# Patient Record
Sex: Male | Born: 1995 | Race: White | Hispanic: No | Marital: Single | Smoking: Never smoker
Health system: Southern US, Community
[De-identification: ages and names within clinical notes are randomized; demographics above are authoritative.]

## PROBLEM LIST (undated history)

## (undated) DIAGNOSIS — Z87442 Personal history of urinary calculi: Secondary | ICD-10-CM

## (undated) DIAGNOSIS — K219 Gastro-esophageal reflux disease without esophagitis: Secondary | ICD-10-CM

## (undated) DIAGNOSIS — J45909 Unspecified asthma, uncomplicated: Secondary | ICD-10-CM

## (undated) HISTORY — PX: BACK SURGERY: SHX140

## (undated) HISTORY — DX: Gastro-esophageal reflux disease without esophagitis: K21.9

## (undated) HISTORY — PX: UMBILICAL HERNIA REPAIR: SHX196

## (undated) HISTORY — PX: OTHER SURGICAL HISTORY: SHX169

## (undated) HISTORY — DX: Unspecified asthma, uncomplicated: J45.909

## (undated) HISTORY — DX: Personal history of urinary calculi: Z87.442

---
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
## 2013-01-14 ENCOUNTER — Telehealth: Payer: Self-pay | Admitting: Family Medicine

## 2013-01-14 NOTE — Telephone Encounter (Signed)
Mom wants to try benadryl first- if it dont help she will bring him in

## 2013-04-28 ENCOUNTER — Other Ambulatory Visit: Payer: Self-pay | Admitting: Family Medicine

## 2013-05-06 ENCOUNTER — Ambulatory Visit (INDEPENDENT_AMBULATORY_CARE_PROVIDER_SITE_OTHER): Payer: Commercial Indemnity

## 2013-05-06 ENCOUNTER — Ambulatory Visit (INDEPENDENT_AMBULATORY_CARE_PROVIDER_SITE_OTHER): Payer: Commercial Indemnity | Admitting: Family Medicine

## 2013-05-06 ENCOUNTER — Encounter: Payer: Self-pay | Admitting: Family Medicine

## 2013-05-06 VITALS — BP 128/73 | HR 65 | Temp 97.6°F | Ht 72.0 in | Wt 212.8 lb

## 2013-05-06 DIAGNOSIS — M79609 Pain in unspecified limb: Secondary | ICD-10-CM

## 2013-05-06 DIAGNOSIS — M79672 Pain in left foot: Secondary | ICD-10-CM

## 2013-05-06 MED ORDER — NAPROXEN 500 MG PO TABS: 500.0000 mg | ORAL_TABLET | Freq: Two times a day (BID) | ORAL | Status: DC

## 2013-05-06 NOTE — Progress Notes (Signed)
  Subjective:    Patient ID: Frank Booth, male    DOB: 09-05-1995, 17 y.o.   MRN: [id number]  HPI This 17 y.o. male presents for evaluation of left foot pain. He has noticed pain when getting out of bed in the am.   Review of Systems C/o left foot pain. No chest pain, SOB, HA, dizziness, vision change, N/V, diarrhea, constipation, dysuria, urinary urgency or frequency or rash.     Objective:   Physical Exam Vital signs noted  Well developed well nourished male.  HEENT - Head atraumatic Normocephalic Respiratory - Lungs CTA bilateral Cardiac - RRR S1 and S2 without murmur MS - TTP left plantar surface of foot.       Assessment & Plan:  Foot pain, left - Plan: DG Foot Complete Left, naproxen (NAPROSYN) 500 MG tablet po bid x 10 days #20.  Stretch foot out bid

## 2013-05-06 NOTE — Patient Instructions (Signed)
Place foot pain patient instructions here.

## 2013-05-06 NOTE — Addendum Note (Signed)
Addended by: Deatra Canter on: 05/06/2013 04:15 PM   Modules accepted: Orders

## 2013-05-07 ENCOUNTER — Ambulatory Visit: Payer: Commercial Indemnity | Admitting: Family Medicine

## 2013-06-16 ENCOUNTER — Telehealth: Payer: Self-pay | Admitting: Family Medicine

## 2013-06-16 NOTE — Telephone Encounter (Signed)
Patient advised if he needed to be seen today to go to urgent care do to [place] being out of spots

## 2013-07-10 ENCOUNTER — Other Ambulatory Visit: Payer: Self-pay | Admitting: Family Medicine

## 2013-07-18 ENCOUNTER — Telehealth: Payer: Self-pay | Admitting: Nurse Practitioner

## 2013-07-18 ENCOUNTER — Ambulatory Visit (INDEPENDENT_AMBULATORY_CARE_PROVIDER_SITE_OTHER): Payer: Commercial Indemnity | Admitting: Nurse Practitioner

## 2013-07-18 ENCOUNTER — Encounter: Payer: Self-pay | Admitting: Nurse Practitioner

## 2013-07-18 VITALS — BP 134/68 | HR 62 | Temp 98.2°F | Ht 72.0 in | Wt 213.0 lb

## 2013-07-18 DIAGNOSIS — K2990 Gastroduodenitis, unspecified, without bleeding: Secondary | ICD-10-CM

## 2013-07-18 DIAGNOSIS — K2970 Gastritis, unspecified, without bleeding: Secondary | ICD-10-CM

## 2013-07-18 MED ORDER — OMEPRAZOLE 40 MG PO CPDR: 40.0000 mg | DELAYED_RELEASE_CAPSULE | Freq: Every day | ORAL | Status: DC

## 2013-07-18 NOTE — Progress Notes (Signed)
   Subjective:    Patient ID: Frank Booth, male    DOB: 1995-11-04, 18 y.o.   MRN: [id number]  Abdominal Pain This is a recurrent problem. The current episode started more than 1 year ago. The onset quality is gradual. The problem occurs intermittently. The problem has been gradually worsening. The pain is located in the epigastric region. The pain is at a severity of 6/10. The pain is moderate. The quality of the pain is sharp and burning. The abdominal pain radiates to the epigastric region. Associated symptoms include nausea. Pertinent negatives include no anorexia, constipation, dysuria, fever, hematuria or vomiting. Nothing aggravates the pain. The pain is relieved by sitting up. He has tried nothing for the symptoms.       Review of Systems  Constitutional: Negative for fever and appetite change.  Respiratory: Negative.   Cardiovascular: Negative.   Gastrointestinal: Positive for nausea and abdominal pain. Negative for vomiting, constipation and anorexia.  Genitourinary: Negative.  Negative for dysuria and hematuria.  All other systems reviewed and are negative.       Objective:   Physical Exam  Constitutional: He is oriented to person, place, and time. He appears well-developed and well-nourished.  Cardiovascular: Normal rate, regular rhythm and normal heart sounds.   Pulmonary/Chest: Effort normal and breath sounds normal.  Abdominal: Soft. Bowel sounds are normal. There is tenderness (miud epigastric to right upper quadrant).  Neurological: He is alert and oriented to person, place, and time.  Skin: Skin is warm and dry.  Psychiatric: He has a normal mood and affect. His behavior is normal. Judgment and thought content normal.   BP 134/68  Pulse 62  Temp(Src) 98.2 F (36.8 C) (Oral)  Ht 6' (1.829 m)  Wt 213 lb (96.616 kg)  BMI 28.88 kg/m2        Assessment & Plan:   1. Gastritis    Meds ordered this encounter  Medications  . omeprazole (PRILOSEC) 40 MG  capsule    Sig: Take 1 capsule (40 mg total) by mouth daily.    Dispense:  30 capsule    Refill:  3    Order Specific Question:  Supervising Provider    Answer:  Ernestina PennaMOORE, DONALD W [[id number]]   Avoid NSAIDS for now Avoid spicy and fatty foods until pain improves Follow upprn  Mary-Margaret Daphine DeutscherMartin, FNP

## 2013-07-18 NOTE — Patient Instructions (Signed)

## 2013-07-28 ENCOUNTER — Ambulatory Visit (INDEPENDENT_AMBULATORY_CARE_PROVIDER_SITE_OTHER): Payer: Commercial Indemnity

## 2013-07-28 ENCOUNTER — Ambulatory Visit (INDEPENDENT_AMBULATORY_CARE_PROVIDER_SITE_OTHER): Payer: Commercial Indemnity | Admitting: Family Medicine

## 2013-07-28 ENCOUNTER — Ambulatory Visit: Payer: Self-pay | Admitting: Family Medicine

## 2013-07-28 ENCOUNTER — Encounter: Payer: Self-pay | Admitting: Family Medicine

## 2013-07-28 ENCOUNTER — Encounter: Payer: Self-pay | Admitting: *Deleted

## 2013-07-28 VITALS — BP 122/59 | HR 69 | Temp 97.0°F | Ht 72.0 in | Wt 213.0 lb

## 2013-07-28 DIAGNOSIS — M79605 Pain in left leg: Secondary | ICD-10-CM

## 2013-07-28 DIAGNOSIS — M79609 Pain in unspecified limb: Secondary | ICD-10-CM

## 2013-07-28 DIAGNOSIS — M25559 Pain in unspecified hip: Secondary | ICD-10-CM

## 2013-07-28 DIAGNOSIS — G629 Polyneuropathy, unspecified: Secondary | ICD-10-CM

## 2013-07-28 DIAGNOSIS — M545 Low back pain, unspecified: Secondary | ICD-10-CM

## 2013-07-28 DIAGNOSIS — G589 Mononeuropathy, unspecified: Secondary | ICD-10-CM

## 2013-07-28 MED ORDER — PREDNISONE 10 MG PO TABS: ORAL_TABLET | ORAL | Status: DC

## 2013-07-28 MED ORDER — METHYLPREDNISOLONE ACETATE 80 MG/ML IJ SUSP
60.0000 mg | Freq: Once | INTRAMUSCULAR | Status: AC
  Administered 2013-07-28: 60 mg via INTRAMUSCULAR

## 2013-07-28 NOTE — Progress Notes (Signed)
Subjective:    Patient ID: Frank Booth, male    DOB: 1996-04-26, 18 y.o.   MRN: [id number]  HPI Patient here today for left thigh pain and numbness. The patient complains of left anterior thigh pain. There's also pain in the left hip which goes to the left knee. It's worse with standing. The patient recalls no injury. He denies any fall. He did go to an urgent care a couple of weeks ago when this started and they gave him an NSAID and he had trouble taking this because it irritated his stomach. this medication was Naprosyn 500 and Nexium.  There is also numbness associated with the pain.     There are no active problems to display for this patient.  Outpatient Encounter Prescriptions as of 07/28/2013  Medication Sig  . naproxen (NAPROSYN) 500 MG tablet TAKE 1 TABLET TWICE A DAY WITH FOOD  . omeprazole (PRILOSEC) 40 MG capsule Take 1 capsule (40 mg total) by mouth daily.    Review of Systems  Constitutional: Negative.   HENT: Negative.   Eyes: Negative.   Respiratory: Negative.   Cardiovascular: Negative.   Gastrointestinal: Negative.   Endocrine: Negative.   Genitourinary: Negative.   Musculoskeletal: Negative.        Left leg pain With numbness after sitting  Skin: Negative.   Allergic/Immunologic: Negative.   Neurological: Negative.   Hematological: Negative.   Psychiatric/Behavioral: Negative.        Objective:   Physical Exam  Nursing note and vitals reviewed. Constitutional: He is oriented to person, place, and time. He appears well-developed and well-nourished. No distress.  HENT:  Head: Normocephalic and atraumatic.  Eyes: Conjunctivae and EOM are normal. Pupils are equal, round, and reactive to light. Right eye exhibits no discharge. Left eye exhibits no discharge. No scleral icterus.  Neck: Normal range of motion.  Pulmonary/Chest: No respiratory distress.  Abdominal: Soft. Bowel sounds are normal. He exhibits no mass. There is no tenderness. There is no  rebound and no guarding.  Musculoskeletal: He exhibits no edema and no tenderness.  Pain in left anterior thigh with leg raising and good leg raising against resistance. Pain with left hip abduction in left anterior thigh. No pain on the right with similar movements. There was no pooling in the back that reproduce pain with palpation.  Lymphadenopathy:    He has no cervical adenopathy.  Neurological: He is alert and oriented to person, place, and time. He has normal reflexes. No cranial nerve deficit.  Reflexes were equal but diminished bilaterally  Skin: Skin is warm and dry. No rash noted.  Psychiatric: He has a normal mood and affect. His behavior is normal. Judgment and thought content normal.   BP 122/59  Pulse 69  Temp(Src) 97 F (36.1 C) (Oral)  Ht 6' (1.829 m)  Wt 213 lb (96.616 kg)  BMI 28.88 kg/m2  [facility] reading (PRIMARY) by  Dr.Moore-LS spine and left hip  -slight scoliosis in the spine otherwise normal hip                                     Assessment & Plan:  1. Left leg pain - DG Lumbar Spine 2-3 Views; Future - DG Hip Complete Left; Future - methylPREDNISolone acetate (DEPO-MEDROL) injection 60 mg; Inject 0.75 mLs (60 mg total) into the muscle once. - predniSONE (DELTASONE) 10 MG tablet; 1 tablet 4 times a day  for 2 days,  1 tablet 3 times a day for 2 days,  1 tablet 2 times a day for 2 days, 1 tablet daily for 2 days  Dispense: 20 tablet; Refill: 0  2. Low back pain - methylPREDNISolone acetate (DEPO-MEDROL) injection 60 mg; Inject 0.75 mLs (60 mg total) into the muscle once. - predniSONE (DELTASONE) 10 MG tablet; 1 tablet 4 times a day for 2 days,  1 tablet 3 times a day for 2 days,  1 tablet 2 times a day for 2 days, 1 tablet daily for 2 days  Dispense: 20 tablet; Refill: 0  3. Neuropathy - methylPREDNISolone acetate (DEPO-MEDROL) injection 60 mg; Inject 0.75 mLs (60 mg total) into the muscle once. - predniSONE (DELTASONE) 10 MG tablet; 1 tablet 4 times a  day for 2 days,  1 tablet 3 times a day for 2 days,  1 tablet 2 times a day for 2 days, 1 tablet daily for 2 days  Dispense: 20 tablet; Refill: 0  Patient Instructions  Use warm wet compresses 20 minutes 3 or 4 times daily to low back Take medication as directed Avoid if possible heavy lifting pushing pulling or straining Discontinue the Naprosyn that has bothered her stomach at home You may take extra strength Tylenol if needed for pain Remain out of work until rechecked in about one week   Nyra Capeson W. Moore MD

## 2013-07-28 NOTE — Patient Instructions (Addendum)
Use warm wet compresses 20 minutes 3 or 4 times daily to low back Take medication as directed Avoid if possible heavy lifting pushing pulling or straining Discontinue the Naprosyn that has bothered her stomach at home You may take extra strength Tylenol if needed for pain Remain out of work until rechecked in about one week

## 2013-08-04 ENCOUNTER — Encounter: Payer: Self-pay | Admitting: Family Medicine

## 2013-08-04 ENCOUNTER — Ambulatory Visit (INDEPENDENT_AMBULATORY_CARE_PROVIDER_SITE_OTHER): Payer: Commercial Indemnity | Admitting: Family Medicine

## 2013-08-04 ENCOUNTER — Encounter: Payer: Self-pay | Admitting: *Deleted

## 2013-08-04 ENCOUNTER — Ambulatory Visit (INDEPENDENT_AMBULATORY_CARE_PROVIDER_SITE_OTHER): Payer: Commercial Indemnity

## 2013-08-04 VITALS — BP 138/69 | HR 73 | Temp 99.4°F | Ht 72.0 in | Wt 216.0 lb

## 2013-08-04 DIAGNOSIS — G629 Polyneuropathy, unspecified: Secondary | ICD-10-CM

## 2013-08-04 DIAGNOSIS — G589 Mononeuropathy, unspecified: Secondary | ICD-10-CM

## 2013-08-04 DIAGNOSIS — M545 Low back pain, unspecified: Secondary | ICD-10-CM

## 2013-08-04 DIAGNOSIS — M25562 Pain in left knee: Secondary | ICD-10-CM

## 2013-08-04 DIAGNOSIS — M25569 Pain in unspecified knee: Secondary | ICD-10-CM
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
## 2013-08-04 NOTE — Patient Instructions (Signed)
Continue to use warm wet compresses to your back 20 minutes 4 times daily Avoid heavy lifting pushing or pulling Reduce the Naprosyn to once daily because of it possibly irritating her stomach Continue to take the omeprazole before breakfast Avoid caffeine

## 2013-08-11 ENCOUNTER — Encounter: Payer: Self-pay | Admitting: Family Medicine

## 2013-08-11 ENCOUNTER — Ambulatory Visit (INDEPENDENT_AMBULATORY_CARE_PROVIDER_SITE_OTHER): Payer: Commercial Indemnity | Admitting: Family Medicine

## 2013-08-11 ENCOUNTER — Encounter: Payer: Self-pay | Admitting: *Deleted

## 2013-08-11 VITALS — BP 146/63 | HR 93 | Temp 99.8°F | Ht 72.0 in | Wt 214.0 lb

## 2013-08-11 DIAGNOSIS — M25569 Pain in unspecified knee: Secondary | ICD-10-CM

## 2013-08-11 DIAGNOSIS — G589 Mononeuropathy, unspecified: Secondary | ICD-10-CM

## 2013-08-11 DIAGNOSIS — M545 Low back pain, unspecified: Secondary | ICD-10-CM

## 2013-08-11 DIAGNOSIS — G629 Polyneuropathy, unspecified: Secondary | ICD-10-CM

## 2013-08-11 DIAGNOSIS — H538 Other visual disturbances: Secondary | ICD-10-CM

## 2013-08-11 DIAGNOSIS — Z8669 Personal history of other diseases of the nervous system and sense organs: Secondary | ICD-10-CM

## 2013-08-11 DIAGNOSIS — M25562 Pain in left knee: Secondary | ICD-10-CM

## 2013-08-11 NOTE — Patient Instructions (Signed)
Try to maintain good posture When picking up materials from the ground level use in your knees Continue to watch your sodium intake and caffeine intake Continue to take the omeprazole to your stomach seems to be improving If you continue to have trouble with the headaches call back and make an appointment for us to check you again Take Tylenol as needed for aches pains or fever

## 2013-08-11 NOTE — Progress Notes (Signed)
   Subjective:    Patient ID: Frank Booth, male    DOB: 1996-01-16, 18 y.o.   MRN: [id number]  HPI Patient here today for 1 week follow up for left leg and hip pain. The patient's back pain leg pain and knee pain have improved. The x-rays done of the back and the knee were within normal limits. He also has a history of migraines. And has had more trouble with that recently. He does describe some blurred vision.     There are no active problems to display for this patient.  Outpatient Encounter Prescriptions as of 08/11/2013  Medication Sig  . naproxen (NAPROSYN) 500 MG tablet TAKE 1 TABLET TWICE A DAY WITH FOOD  . omeprazole (PRILOSEC) 40 MG capsule Take 1 capsule (40 mg total) by mouth daily.    Review of Systems  Constitutional: Negative.   HENT: Negative.   Eyes: Negative.   Respiratory: Negative.   Cardiovascular: Negative.   Gastrointestinal: Negative.   Endocrine: Negative.   Genitourinary: Negative.   Musculoskeletal: Positive for arthralgias (left leg and hip pain- better this week).  Skin: Negative.   Allergic/Immunologic: Negative.   Neurological: Negative.   Hematological: Negative.   Psychiatric/Behavioral: Negative.        Objective:   Physical Exam  Nursing note and vitals reviewed. Constitutional: He is oriented to person, place, and time. He appears well-developed and well-nourished. No distress.  HENT:  Head: Normocephalic and atraumatic.  Eyes: Conjunctivae and EOM are normal. Pupils are equal, round, and reactive to light. Right eye exhibits no discharge. Left eye exhibits no discharge. No scleral icterus.  Neck: Normal range of motion. Neck supple. No thyromegaly present.  Cardiovascular: Normal rate, regular rhythm, normal heart sounds and intact distal pulses.  Exam reveals no gallop and no friction rub.   No murmur heard. Pulmonary/Chest: Effort normal and breath sounds normal. No respiratory distress. He has no wheezes. He has no rales. He  exhibits no tenderness.  Abdominal: Soft. Bowel sounds are normal. He exhibits no mass. There is tenderness (minimal epigastric tenderness). There is no rebound and no guarding.  Musculoskeletal: Normal range of motion. He exhibits no edema and no tenderness.  Leg raising and hip abduction bilaterally within normal limits without pain or back pain  Lymphadenopathy:    He has no cervical adenopathy.  Neurological: He is alert and oriented to person, place, and time. He has normal reflexes.  Skin: Skin is warm and dry. No rash noted. No erythema. No pallor.  Psychiatric: He has a normal mood and affect. His behavior is normal. Judgment and thought content normal.   BP 146/63  Pulse 93  Temp(Src) 99.8 F (37.7 C) (Oral)  Ht 6' (1.829 m)  Wt 214 lb (97.07 kg)  BMI 29.02 kg/m2  vision was checked while in the office      Assessment & Plan:  1. Left knee pain, improved  2. Low back pain, improved  3. Neuropathy, improved  4. History of migraine headaches  5. Blurred vision Patient Instructions  Try to maintain good posture When picking up materials from the ground level use in your knees Continue to watch your sodium intake and caffeine intake Continue to take the omeprazole to your stomach seems to be improving If you continue to have trouble with the headaches call back and make an appointment for us to check you again Take Tylenol as needed for aches pains or fever    Nyra Capeson W. Moore MD

## 2013-10-01 ENCOUNTER — Ambulatory Visit (INDEPENDENT_AMBULATORY_CARE_PROVIDER_SITE_OTHER): Payer: Commercial Indemnity | Admitting: Family Medicine

## 2013-10-01 VITALS — BP 128/72 | HR 65 | Temp 97.7°F | Ht 72.0 in | Wt 217.0 lb

## 2013-10-01 DIAGNOSIS — J209 Acute bronchitis, unspecified: Secondary | ICD-10-CM

## 2013-10-01 MED ORDER — ALBUTEROL SULFATE HFA 108 (90 BASE) MCG/ACT IN AERS: 1.0000 | INHALATION_SPRAY | RESPIRATORY_TRACT | Status: DC | PRN

## 2013-10-01 MED ORDER — AZITHROMYCIN 250 MG PO TABS: ORAL_TABLET | ORAL | Status: DC

## 2013-10-01 NOTE — Progress Notes (Signed)
   Subjective:    Patient ID: Frank Booth, male    DOB: 16-Sep-1995, 18 y.o.   MRN: [id number]  HPI  This 18 y.o. male presents for evaluation of URI sx's and wheezing.  He needs refill On albuterol MDI.  Review of Systems    No chest pain, SOB, HA, dizziness, vision change, N/V, diarrhea, constipation, dysuria, urinary urgency or frequency, myalgias, arthralgias or rash.  Objective:   Physical Exam  Vital signs noted  Well developed well nourished male.  HEENT - Head atraumatic Normocephalic                Eyes - PERRLA, Conjuctiva - clear Sclera- Clear EOMI                Ears - EAC's Wnl TM's Wnl Gross Hearing WNL                Nose - Nares patent                 Throat - oropharanx wnl Respiratory - Lungs CTA bilateral Cardiac - RRR S1 and S2 without murmur GI - Abdomen soft Nontender and bowel sounds active x 4 Extremities - No edema. Neuro - Grossly intact.      Assessment & Plan:  Acute bronchitis - Plan: albuterol (PROVENTIL HFA;VENTOLIN HFA) 108 (90 BASE) MCG/ACT inhaler, azithromycin (ZITHROMAX) 250 MG tablet Symbicort 160/4.5 2 puffs bid #1 sample Follow up if not better.  Deatra CanterWilliam J Oxford FNP

## 2014-01-15 ENCOUNTER — Other Ambulatory Visit: Payer: Self-pay | Admitting: Nurse Practitioner

## 2014-02-02 ENCOUNTER — Other Ambulatory Visit: Payer: Self-pay | Admitting: Family Medicine

## 2014-02-02 ENCOUNTER — Ambulatory Visit
Admission: RE | Admit: 2014-02-02 | Discharge: 2014-02-02 | Disposition: A | Discharge status: Home / Self Care | Payer: Managed Care, Other (non HMO) | Source: Ambulatory Visit | Attending: Family Medicine | Admitting: Family Medicine

## 2014-02-02 DIAGNOSIS — R109 Unspecified abdominal pain: Secondary | ICD-10-CM

## 2014-02-02 MED ORDER — IOHEXOL 300 MG/ML  SOLN
100.0000 mL | Freq: Once | INTRAMUSCULAR | Status: AC | PRN
  Administered 2014-02-02: 100 mL via INTRAVENOUS

## 2016-09-02 ENCOUNTER — Emergency Department (HOSPITAL_COMMUNITY)
Admission: EM | Admit: 2016-09-02 | Discharge: 2016-09-02 | Disposition: A | Discharge status: Home / Self Care | Payer: Managed Care, Other (non HMO) | Attending: Emergency Medicine | Admitting: Emergency Medicine

## 2016-09-02 ENCOUNTER — Emergency Department (HOSPITAL_COMMUNITY): Payer: Managed Care, Other (non HMO)

## 2016-09-02 ENCOUNTER — Encounter (HOSPITAL_COMMUNITY): Payer: Self-pay

## 2016-09-02 DIAGNOSIS — R091 Pleurisy: Secondary | ICD-10-CM | POA: Diagnosis not present

## 2016-09-02 DIAGNOSIS — R071 Chest pain on breathing: Secondary | ICD-10-CM | POA: Diagnosis present

## 2016-09-02 LAB — CBC
HEMATOCRIT: 44.8 % (ref 39.0–52.0)
HEMOGLOBIN: 15.7 g/dL (ref 13.0–17.0)
MCH: 32 pg (ref 26.0–34.0)
MCHC: 35 g/dL (ref 30.0–36.0)
MCV: 91.4 fL (ref 78.0–100.0)
Platelets: 297 10*3/uL (ref 150–400)
RBC: 4.9 MIL/uL (ref 4.22–5.81)
RDW: 13 % (ref 11.5–15.5)
WBC: 8.7 10*3/uL (ref 4.0–10.5)

## 2016-09-02 LAB — BASIC METABOLIC PANEL
ANION GAP: 12 (ref 5–15)
BUN: <5 mg/dL — ABNORMAL LOW (ref 6–20)
CHLORIDE: 104 mmol/L (ref 101–111)
CO2: 24 mmol/L (ref 22–32)
Calcium: 9 mg/dL (ref 8.9–10.3)
Creatinine, Ser: 0.98 mg/dL (ref 0.61–1.24)
GFR calc Af Amer: >60 mL/min (ref >60)
GFR calc non Af Amer: >60 mL/min (ref >60)
Glucose, Bld: 92 mg/dL (ref 65–99)
POTASSIUM: 3.6 mmol/L (ref 3.5–5.1)
SODIUM: 140 mmol/L (ref 135–145)

## 2016-09-02 LAB — I-STAT TROPONIN, ED: Troponin i, poc: 0 ng/mL (ref 0.00–0.08)

## 2016-09-02 MED ORDER — BENZONATATE 100 MG PO CAPS: 100.0000 mg | ORAL_CAPSULE | Freq: Three times a day (TID) | ORAL | 0 refills | Status: DC

## 2016-09-02 MED ORDER — NAPROXEN 500 MG PO TABS: 500.0000 mg | ORAL_TABLET | Freq: Two times a day (BID) | ORAL | 0 refills | Status: DC

## 2016-09-02 NOTE — ED Provider Notes (Signed)
[facility]-EMERGENCY DEPT Provider Note   CSN: [id number] Arrival date & time: 09/02/16  [id number]  By signing my name below, I, Nelwyn SalisburyJoshua Fowler, attest that this documentation has been prepared under the direction and in the presence of Linwood DibblesJon Debany Vantol, MD . Electronically Signed: Nelwyn SalisburyJoshua Fowler, Scribe. 09/02/2016. 3:48 AM.  History   Chief Complaint Chief Complaint  Patient presents with  . Chest Pain    The history is provided by the patient. No language interpreter was used.    HPI Comments:  Frank Booth is a 21 y.o. male who presents to the Emergency Department complaining of sudden-onset, constant, mild chest pain which began 12 hours ago. He describes his symptoms as radiating from his chest into his lower back. His pain is exacerbated with deep inhalation. Pt reports associated cough which has been present for about 2 weeks. He denies any leg swelling, fever, vomiting or nausea.   Past Medical History:  Diagnosis Date  . GERD (gastroesophageal reflux disease)     There are no active problems to display for this patient.   Past Surgical History:  Procedure Laterality Date  . tubes in ears    . UMBILICAL HERNIA REPAIR    . Widom teeth extraction         Home Medications    Prior to Admission medications   Medication Sig Start Date End Date Taking? Authorizing Provider  albuterol (PROVENTIL HFA;VENTOLIN HFA) 108 (90 BASE) MCG/ACT inhaler Inhale 1-2 puffs into the lungs every 4 (four) hours as needed for wheezing or shortness of breath. 10/01/13   Deatra CanterWilliam J Oxford, FNP  azithromycin (ZITHROMAX) 250 MG tablet Take 2 po first day and then one po qd x 4 days 10/01/13   Deatra CanterWilliam J Oxford, FNP  benzonatate (TESSALON) 100 MG capsule Take 1 capsule (100 mg total) by mouth every 8 (eight) hours. 09/02/16   Linwood DibblesJon Adryel Wortmann, MD  naproxen (NAPROSYN) 500 MG tablet Take 1 tablet (500 mg total) by mouth 2 (two) times daily with a meal. As needed for pain 09/02/16   Linwood DibblesJon Wynetta Seith, MD  omeprazole (PRILOSEC)  40 MG capsule Take 1 capsule (40 mg total) by mouth daily. 07/18/13   Mary-Margaret Daphine DeutscherMartin, FNP  omeprazole (PRILOSEC) 40 MG capsule TAKE 1 CAPSULE (40 MG TOTAL) BY MOUTH DAILY.    Ernestina Pennaonald W Moore, MD    Family History Family History  Problem Relation Age of Onset  . Hypertension Father   . Heart disease Maternal Grandfather   . Hypertension Maternal Grandfather   . Cancer Maternal Grandfather     Social History Social History  Substance Use Topics  . Smoking status: Never Smoker  . Smokeless tobacco: Never Used  . Alcohol use No     Allergies   Patient has no known allergies.   Review of Systems Review of Systems  Constitutional: Negative for fever.  Cardiovascular: Positive for chest pain. Negative for leg swelling.  Gastrointestinal: Negative for nausea and vomiting.  Musculoskeletal: Positive for back pain.  Hematological: Does not bruise/bleed easily.  All other systems reviewed and are negative.    Physical Exam Updated Vital Signs There were no vitals taken for this visit.  Physical Exam  Constitutional: He appears well-developed and well-nourished. No distress.  HENT:  Head: Normocephalic and atraumatic.  Right Ear: External ear normal.  Left Ear: External ear normal.  Eyes: Conjunctivae are normal. Right eye exhibits no discharge. Left eye exhibits no discharge. No scleral icterus.  Neck: Neck supple. No tracheal deviation present.  Cardiovascular: Normal rate, regular rhythm and intact distal pulses.   Pulmonary/Chest: Effort normal and breath sounds normal. No stridor. No respiratory distress. He has no wheezes. He has no rales.  Abdominal: Soft. Bowel sounds are normal. He exhibits no distension. There is no tenderness. There is no rebound and no guarding.  Musculoskeletal: He exhibits no edema or tenderness.  Neurological: He is alert. He has normal strength. No cranial nerve deficit (no facial droop, extraocular movements intact, no slurred speech) or  sensory deficit. He exhibits normal muscle tone. He displays no seizure activity. Coordination normal.  Skin: Skin is warm and dry. No rash noted.  Psychiatric: He has a normal mood and affect.  Nursing note and vitals reviewed.    ED Treatments / Results   COORDINATION OF CARE:  3:49 AM Discussed treatment plan with pt at bedside which includes cough medications and pt agreed to plan.  Labs (all labs ordered are listed, but only abnormal results are displayed) Labs Reviewed  BASIC METABOLIC PANEL - Abnormal; Notable for the following:       Result Value   BUN <5 (*)    All other components within normal limits  CBC  I-STAT TROPOININ, ED    EKG  EKG Interpretation  Date/Time:  Saturday September 02 2016 00:35:14 EDT Ventricular Rate:  82 PR Interval:  150 QRS Duration: 88 QT Interval:  368 QTC Calculation: 429 R Axis:   63 Text Interpretation:   Poor data quality, interpretation may be adversely affected Normal sinus rhythm Normal ECG No old tracing to compare Confirmed by [facility]  MD, DAVID ([id number]) on 09/02/2016 2:26:19 AM       Radiology Dg Chest 2 View  Result Date: 09/02/2016 CLINICAL DATA:  Chest pain and hemoptysis EXAM: CHEST  2 VIEW COMPARISON:  None. FINDINGS: The heart size and mediastinal contours are within normal limits. Both lungs are clear. The visualized skeletal structures are unremarkable. IMPRESSION: No active cardiopulmonary disease. Electronically Signed   By: Deatra Robinson M.D.   On: 09/02/2016 01:34    Procedures Procedures (including critical care time)  Medications Ordered in ED Medications - No data to display   Initial Impression / Assessment and Plan / ED Course  I have reviewed the triage vital signs and the nursing notes.  Pertinent labs & imaging results that were available during my care of the patient were reviewed by me and considered in my medical decision making (see chart for details).   Labs and xrays are reasuring.  No PNA.   Doubt PE, ACS. Sx are suggestive of viral pleurisy.   Will dc home with nsaids and cough medicine.  Final Clinical Impressions(s) / ED Diagnoses   Final diagnoses:  Pleurisy    New Prescriptions New Prescriptions   BENZONATATE (TESSALON) 100 MG CAPSULE    Take 1 capsule (100 mg total) by mouth every 8 (eight) hours.   NAPROXEN (NAPROSYN) 500 MG TABLET    Take 1 tablet (500 mg total) by mouth 2 (two) times daily with a meal. As needed for pain  I personally performed the services described in this documentation, which was scribed in my presence.  The recorded information has been reviewed and is accurate.     Linwood Dibbles, MD 09/02/16 [phone]

## 2016-09-02 NOTE — ED Triage Notes (Signed)
Pt states that since about 4pm today he has had CP in the center of his of his chest along with SOB and nausea. Pain radiates to back.

## 2016-09-02 NOTE — Discharge Instructions (Signed)
Follow up with your primary doctor next week if the symptoms persist, take the medications as prescribed

## 2016-09-02 NOTE — ED Notes (Signed)
Patient presents stating he has been having pain in the center of his chest since he has been having a cough for approximately 4 weeks.  States the cough is a dry cough.  States the discomfort in his chest does increase with movement.

## 2016-09-02 NOTE — ED Notes (Signed)
Discharge instructions and prescription reviewed - voiced understanding.  

## 2016-12-12 ENCOUNTER — Encounter (HOSPITAL_COMMUNITY): Payer: Self-pay | Admitting: *Deleted

## 2016-12-12 DIAGNOSIS — W57XXXA Bitten or stung by nonvenomous insect and other nonvenomous arthropods, initial encounter: Secondary | ICD-10-CM | POA: Diagnosis not present

## 2016-12-12 DIAGNOSIS — M791 Myalgia: Secondary | ICD-10-CM | POA: Diagnosis not present

## 2016-12-12 DIAGNOSIS — Y999 Unspecified external cause status: Secondary | ICD-10-CM | POA: Diagnosis not present

## 2016-12-12 DIAGNOSIS — Y9389 Activity, other specified: Secondary | ICD-10-CM | POA: Insufficient documentation

## 2016-12-12 DIAGNOSIS — Y929 Unspecified place or not applicable: Secondary | ICD-10-CM | POA: Insufficient documentation

## 2016-12-12 DIAGNOSIS — B349 Viral infection, unspecified: Secondary | ICD-10-CM | POA: Insufficient documentation

## 2016-12-12 DIAGNOSIS — Z79899 Other long term (current) drug therapy: Secondary | ICD-10-CM | POA: Diagnosis not present

## 2016-12-12 DIAGNOSIS — R21 Rash and other nonspecific skin eruption: Secondary | ICD-10-CM | POA: Insufficient documentation

## 2016-12-12 DIAGNOSIS — S30863A Insect bite (nonvenomous) of scrotum and testes, initial encounter: Secondary | ICD-10-CM | POA: Insufficient documentation

## 2016-12-12 LAB — COMPREHENSIVE METABOLIC PANEL
ALT: 39 U/L (ref 17–63)
AST: 45 U/L — ABNORMAL HIGH (ref 15–41)
Albumin: 4.1 g/dL (ref 3.5–5.0)
Alkaline Phosphatase: 70 U/L (ref 38–126)
Anion gap: 7 (ref 5–15)
BUN: 9 mg/dL (ref 6–20)
CO2: 28 mmol/L (ref 22–32)
Calcium: 9 mg/dL (ref 8.9–10.3)
Chloride: 103 mmol/L (ref 101–111)
Creatinine, Ser: 1.16 mg/dL (ref 0.61–1.24)
GFR calc Af Amer: >60 mL/min (ref >60)
GFR calc non Af Amer: >60 mL/min (ref >60)
Glucose, Bld: 102 mg/dL — ABNORMAL HIGH (ref 65–99)
Potassium: 3.8 mmol/L (ref 3.5–5.1)
Sodium: 138 mmol/L (ref 135–145)
Total Bilirubin: 0.5 mg/dL (ref 0.3–1.2)
Total Protein: 7 g/dL (ref 6.5–8.1)

## 2016-12-12 LAB — CBC WITH DIFFERENTIAL/PLATELET
Basophils Absolute: 0.1 10*3/uL (ref 0.0–0.1)
Basophils Relative: 1 %
Eosinophils Absolute: 0.1 10*3/uL (ref 0.0–0.7)
Eosinophils Relative: 3 %
HCT: 42.6 % (ref 39.0–52.0)
Hemoglobin: 14.5 g/dL (ref 13.0–17.0)
Lymphocytes Relative: 44 %
Lymphs Abs: 2 10*3/uL (ref 0.7–4.0)
MCH: 31.5 pg (ref 26.0–34.0)
MCHC: 34 g/dL (ref 30.0–36.0)
MCV: 92.4 fL (ref 78.0–100.0)
Monocytes Absolute: 0.4 10*3/uL (ref 0.1–1.0)
Monocytes Relative: 10 %
Neutro Abs: 1.9 10*3/uL (ref 1.7–7.7)
Neutrophils Relative %: 42 %
Platelets: 284 10*3/uL (ref 150–400)
RBC: 4.61 MIL/uL (ref 4.22–5.81)
RDW: 12.6 % (ref 11.5–15.5)
WBC: 4.4 10*3/uL (ref 4.0–10.5)

## 2016-12-12 LAB — I-STAT CG4 LACTIC ACID, ED: Lactic Acid, Venous: 1.33 mmol/L (ref 0.5–1.9)

## 2016-12-12 NOTE — ED Triage Notes (Signed)
Pt states he began having body aches on Saturday that progressed to testicle pain and then numbness to the bilateral arms/hands, feet/legs. Pt states he continues to have generalized b/a and also endorses having ten ticks on him on Saturday.

## 2016-12-13 ENCOUNTER — Emergency Department (HOSPITAL_COMMUNITY)
Admission: EM | Admit: 2016-12-13 | Discharge: 2016-12-13 | Disposition: A | Discharge status: Home / Self Care | Payer: Managed Care, Other (non HMO) | Attending: Emergency Medicine | Admitting: Emergency Medicine

## 2016-12-13 DIAGNOSIS — R21 Rash and other nonspecific skin eruption: Secondary | ICD-10-CM

## 2016-12-13 DIAGNOSIS — W57XXXA Bitten or stung by nonvenomous insect and other nonvenomous arthropods, initial encounter: Secondary | ICD-10-CM

## 2016-12-13 DIAGNOSIS — B349 Viral infection, unspecified: Secondary | ICD-10-CM

## 2016-12-13 LAB — I-STAT CG4 LACTIC ACID, ED: Lactic Acid, Venous: 1 mmol/L (ref 0.5–1.9)

## 2016-12-13 MED ORDER — DOXYCYCLINE HYCLATE 100 MG PO CAPS: 100.0000 mg | ORAL_CAPSULE | Freq: Two times a day (BID) | ORAL | 0 refills | Status: DC

## 2016-12-13 NOTE — Discharge Instructions (Signed)
Take the DOXYCYCLINE as directed.  It can make you more sensitive to sunburn.  It will treat for skin infection as well as for tick-bourne illness.  Please follow-up with your doctor if not improving with treatment.  Please return to the ER if your symptoms worsen.

## 2016-12-13 NOTE — ED Provider Notes (Signed)
[facility]-EMERGENCY DEPT Provider Note   CSN: [id number] Arrival date & time: 12/12/16  2222     History   Chief Complaint Chief Complaint  Patient presents with  . Generalized Body Aches    HPI Frank Booth is a 21 y.o. male.  Patient presents emergency department with chief complaint of generalized body aches and fatigue. He states that his symptoms started several days ago. States that he has also noticed a small rash on his scrotum. He states that he is bitten by several ticks last week, and did have a bite near the rash. He denies any fevers or chills. He denies any nausea or vomiting. He states that his arms and legs feel intermittently weak, and that he has some intermittent numbness sensation. He also reports having sore throat. He was seen by his doctor yesterday, who diagnosed him with a viral illness, and encouraged rest and fluids. He had a negative flu test, negative strep test, and was advised supportive care.   The history is provided by the patient. No language interpreter was used.    Past Medical History:  Diagnosis Date  . GERD (gastroesophageal reflux disease)     There are no active problems to display for this patient.   Past Surgical History:  Procedure Laterality Date  . tubes in ears    . UMBILICAL HERNIA REPAIR    . Widom teeth extraction         Home Medications    Prior to Admission medications   Medication Sig Start Date End Date Taking? Authorizing Provider  albuterol (PROVENTIL HFA;VENTOLIN HFA) 108 (90 BASE) MCG/ACT inhaler Inhale 1-2 puffs into the lungs every 4 (four) hours as needed for wheezing or shortness of breath. 10/01/13   Deatra Canterxford, William J, FNP  azithromycin (ZITHROMAX) 250 MG tablet Take 2 po first day and then one po qd x 4 days 10/01/13   Deatra Canterxford, William J, FNP  benzonatate (TESSALON) 100 MG capsule Take 1 capsule (100 mg total) by mouth every 8 (eight) hours. 09/02/16   Linwood DibblesKnapp, Jon, MD  naproxen (NAPROSYN) 500 MG tablet Take  1 tablet (500 mg total) by mouth 2 (two) times daily with a meal. As needed for pain 09/02/16   Linwood DibblesKnapp, Jon, MD  omeprazole (PRILOSEC) 40 MG capsule Take 1 capsule (40 mg total) by mouth daily. 07/18/13   Daphine DeutscherMartin, Mary-Margaret, FNP  omeprazole (PRILOSEC) 40 MG capsule TAKE 1 CAPSULE (40 MG TOTAL) BY MOUTH DAILY.    Ernestina PennaMoore, Donald W, MD    Family History Family History  Problem Relation Age of Onset  . Hypertension Father   . Heart disease Maternal Grandfather   . Hypertension Maternal Grandfather   . Cancer Maternal Grandfather     Social History Social History  Substance Use Topics  . Smoking status: Never Smoker  . Smokeless tobacco: Never Used  . Alcohol use No     Allergies   Patient has no known allergies.   Review of Systems Review of Systems  All other systems reviewed and are negative.    Physical Exam Updated Vital Signs BP 132/84 (BP Location: Left Arm)   Pulse 69   Temp 98.2 F (36.8 C) (Oral)   Resp 16   Ht 6\' 2"  (1.88 m)   Wt 103.6 kg (228 lb 5 oz)   SpO2 100%   BMI 29.31 kg/m   Physical Exam  Constitutional: He is oriented to person, place, and time. He appears well-developed and well-nourished.  HENT:  Head:  Normocephalic and atraumatic.  Eyes: Conjunctivae and EOM are normal. Pupils are equal, round, and reactive to light. Right eye exhibits no discharge. Left eye exhibits no discharge. No scleral icterus.  Neck: Normal range of motion. Neck supple. No JVD present.  Cardiovascular: Normal rate, regular rhythm and normal heart sounds.  Exam reveals no gallop and no friction rub.   No murmur heard. Pulmonary/Chest: Effort normal and breath sounds normal. No respiratory distress. He has no wheezes. He has no rales. He exhibits no tenderness.  Abdominal: Soft. He exhibits no distension and no mass. There is no tenderness. There is no rebound and no guarding.  Genitourinary:  Genitourinary Comments: 2 x 2 circular rash to left scrotum without evidence  of abscess, no masses or tenderness, no penile discharge  Musculoskeletal: Normal range of motion. He exhibits no edema or tenderness.  Moves all extremities, range of motion and strength is 5/5 throughout  Neurological: He is alert and oriented to person, place, and time.  Sensation and strength intact in bilateral upper and lower extremities CN III-12 intact Speech is clear Movements are goal oriented  Skin: Skin is warm and dry.  Psychiatric: He has a normal mood and affect. His behavior is normal. Judgment and thought content normal.  Nursing note and vitals reviewed.    ED Treatments / Results  Labs (all labs ordered are listed, but only abnormal results are displayed) Labs Reviewed  COMPREHENSIVE METABOLIC PANEL - Abnormal; Notable for the following:       Result Value   Glucose, Bld 102 (*)    AST 45 (*)    All other components within normal limits  CBC WITH DIFFERENTIAL/PLATELET  URINALYSIS, ROUTINE W REFLEX MICROSCOPIC  LYME DISEASE DNA BY PCR(BORRELIA BURG)  ROCKY MTN SPOTTED FVR ABS PNL(IGG+IGM)  I-STAT CG4 LACTIC ACID, ED  I-STAT CG4 LACTIC ACID, ED    EKG  EKG Interpretation None       Radiology No results found.  Procedures Procedures (including critical care time)  Medications Ordered in ED Medications - No data to display   Initial Impression / Assessment and Plan / ED Course  I have reviewed the triage vital signs and the nursing notes.  Pertinent labs & imaging results that were available during my care of the patient were reviewed by me and considered in my medical decision making (see chart for details).     Patient with vague symptoms including fatigue, generalized body aches, as well as some intermittent numbness in his bilateral upper and lower extremities. He was seen yesterday by his PCP, and was diagnosed with viral illness. He was tested for flu as well as strep, but these tests were negative. He was encouraged to drink fluids, rest,  and take Tylenol. He did have recent take exposure, does have a rash, where he was bitten by take. Will check Lyme and RMSF. Will also cover with doxycycline. I have advised the patient follow-up closely with his PCP. Specific return precautions given. Patient understands and agrees with plan. He is well-appearing. He is stable for discharge and outpatient follow-up.  Final Clinical Impressions(s) / ED Diagnoses   Final diagnoses:  Viral syndrome  Tick bite, initial encounter  Rash on scrotum    New Prescriptions New Prescriptions   DOXYCYCLINE (VIBRAMYCIN) 100 MG CAPSULE    Take 1 capsule (100 mg total) by mouth 2 (two) times daily.     Roxy Horseman, PA-C 12/13/16 0126    Azalia Bilis, MD 12/13/16 [phone]

## 2016-12-14 LAB — ROCKY MTN SPOTTED FVR ABS PNL(IGG+IGM)
RMSF IGG: NEGATIVE
RMSF IGM: 0.35 {index} (ref 0.00–0.89)

## 2016-12-14 LAB — B. BURGDORFI ANTIBODIES

## 2018-09-09 DIAGNOSIS — M5412 Radiculopathy, cervical region: Secondary | ICD-10-CM | POA: Insufficient documentation

## 2020-05-29 ENCOUNTER — Emergency Department (HOSPITAL_BASED_OUTPATIENT_CLINIC_OR_DEPARTMENT_OTHER)
Admission: EM | Admit: 2020-05-29 | Discharge: 2020-05-29 | Disposition: A | Discharge status: Home / Self Care | Payer: Managed Care, Other (non HMO) | Attending: Emergency Medicine | Admitting: Emergency Medicine

## 2020-05-29 ENCOUNTER — Encounter (HOSPITAL_BASED_OUTPATIENT_CLINIC_OR_DEPARTMENT_OTHER): Payer: Self-pay | Admitting: Emergency Medicine

## 2020-05-29 ENCOUNTER — Other Ambulatory Visit: Payer: Self-pay

## 2020-05-29 DIAGNOSIS — Z20822 Contact with and (suspected) exposure to covid-19: Secondary | ICD-10-CM | POA: Insufficient documentation

## 2020-05-29 DIAGNOSIS — J069 Acute upper respiratory infection, unspecified: Secondary | ICD-10-CM

## 2020-05-29 LAB — RESP PANEL BY RT-PCR (FLU A&B, COVID) ARPGX2
Influenza A by PCR: NEGATIVE
Influenza B by PCR: NEGATIVE
SARS Coronavirus 2 by RT PCR: NEGATIVE

## 2020-05-29 MED ORDER — BENZONATATE 100 MG PO CAPS: 100.0000 mg | ORAL_CAPSULE | Freq: Three times a day (TID) | ORAL | 0 refills | Status: DC

## 2020-05-29 NOTE — ED Notes (Signed)
Pt discharged to home. Discharge instructions have been discussed with patient and/or family members. Pt verbally acknowledges understanding d/c instructions, and endorses comprehension to checkout at registration before leaving.  °

## 2020-05-29 NOTE — ED Triage Notes (Signed)
Cough, headache x 1 week.

## 2020-05-29 NOTE — ED Provider Notes (Signed)
[facility] EMERGENCY DEPARTMENT Provider Note   CSN: [id number] Arrival date & time: 05/29/20  1143     History Chief Complaint  Patient presents with  . Cough  . Sore Throat    Frank Booth is a 24 y.o. male with no relevant past medical history presents the ED with a 1 week history of cough and headache.  Patient reports that he has been experiencing headache, head congestion, nonproductive cough, diminished appetite, and abdominal discomfort worse when coughing x1 week.  He lives with his wife and mother-in-law, they have both been ill with similar illnesses.  Patient also endorses a mild intermittent sore throat, but nothing significant.  Can eat and drink without issue.  He states that he was COVID-19 positive back in July 2021 and that this felt similar.  He wanted to come to the ED for testing.  He states that he has been taking TheraFlu at home, with little relief.  He recently moved back to [place] from [place] and has not yet established with a primary care provider.  He denies any chest pain, difficulty breathing, fevers or chills, room spinning dizziness, numbness or weakness, vomiting, diarrhea, other changes in bowel habits, or urinary symptoms.  He has not been immunized for COVID-19.  HPI     Past Medical History:  Diagnosis Date  . GERD (gastroesophageal reflux disease)     There are no problems to display for this patient.   Past Surgical History:  Procedure Laterality Date  . tubes in ears    . UMBILICAL HERNIA REPAIR    . Widom teeth extraction         Family History  Problem Relation Age of Onset  . Hypertension Father   . Heart disease Maternal Grandfather   . Hypertension Maternal Grandfather   . Cancer Maternal Grandfather     Social History   Tobacco Use  . Smoking status: Never Smoker  . Smokeless tobacco: Never Used  Vaping Use  . Vaping Use: Never used  Substance Use Topics  . Alcohol use: No  . Drug use:  No    Home Medications Prior to Admission medications   Medication Sig Start Date End Date Taking? Authorizing Provider  albuterol (PROVENTIL HFA;VENTOLIN HFA) 108 (90 BASE) MCG/ACT inhaler Inhale 1-2 puffs into the lungs every 4 (four) hours as needed for wheezing or shortness of breath. 10/01/13   Deatra Canter, FNP  azithromycin (ZITHROMAX) 250 MG tablet Take 2 po first day and then one po qd x 4 days 10/01/13   Deatra Canter, FNP  benzonatate (TESSALON) 100 MG capsule Take 1 capsule (100 mg total) by mouth every 8 (eight) hours. 09/02/16   Linwood Dibbles, MD  doxycycline (VIBRAMYCIN) 100 MG capsule Take 1 capsule (100 mg total) by mouth 2 (two) times daily. 12/13/16   Roxy Horseman, PA-C  naproxen (NAPROSYN) 500 MG tablet Take 1 tablet (500 mg total) by mouth 2 (two) times daily with a meal. As needed for pain 09/02/16   Linwood Dibbles, MD  omeprazole (PRILOSEC) 40 MG capsule Take 1 capsule (40 mg total) by mouth daily. 07/18/13   Daphine Deutscher, Mary-Margaret, FNP  omeprazole (PRILOSEC) 40 MG capsule TAKE 1 CAPSULE (40 MG TOTAL) BY MOUTH DAILY.    Ernestina Penna, MD    Allergies    Patient has no known allergies.  Review of Systems   Review of Systems  All other systems reviewed and are negative.   Physical Exam Updated  Vital Signs BP 118/77 (BP Location: Left Arm)   Pulse 73   Temp 99.2 F (37.3 C) (Oral)   Resp 18   Ht 6\' 2"  (1.88 m)   Wt 99.8 kg   SpO2 98%   BMI 28.25 kg/m   Physical Exam Vitals and nursing note reviewed. Exam conducted with a chaperone present.  Constitutional:      General: He is not in acute distress.    Appearance: He is not ill-appearing.  HENT:     Head: Normocephalic and atraumatic.     Mouth/Throat:     Pharynx: Oropharynx is clear.     Comments: Poor dentition throughout.  Recommending dentist.  Patent oropharynx.  No exudates or tonsillar hypertrophy. Eyes:     General: No scleral icterus.    Conjunctiva/sclera: Conjunctivae normal.   Cardiovascular:     Rate and Rhythm: Normal rate and regular rhythm.     Pulses: Normal pulses.     Heart sounds: Normal heart sounds.  Pulmonary:     Effort: Pulmonary effort is normal. No respiratory distress.     Breath sounds: Normal breath sounds. No wheezing or rales.     Comments: CTA bilaterally.  No increased work of breathing.  No tachypnea. Abdominal:     General: Abdomen is flat. There is no distension.     Palpations: Abdomen is soft.     Tenderness: There is no abdominal tenderness. There is no guarding.  Musculoskeletal:        General: Normal range of motion.  Skin:    General: Skin is dry.     Capillary Refill: Capillary refill takes less than 2 seconds.  Neurological:     General: No focal deficit present.     Mental Status: He is alert and oriented to person, place, and time.     GCS: GCS eye subscore is 4. GCS verbal subscore is 5. GCS motor subscore is 6.  Psychiatric:        Mood and Affect: Mood normal.        Behavior: Behavior normal.        Thought Content: Thought content normal.     ED Results / Procedures / Treatments   Labs (all labs ordered are listed, but only abnormal results are displayed) Labs Reviewed  RESP PANEL BY RT-PCR (FLU A&B, COVID) ARPGX2    EKG None  Radiology No results found.  Procedures Procedures (including critical care time)  Medications Ordered in ED Medications - No data to display  ED Course  I have reviewed the triage vital signs and the nursing notes.  Pertinent labs & imaging results that were available during my care of the patient were reviewed by me and considered in my medical decision making (see chart for details).    MDM Rules/Calculators/A&P                          Patient with symptoms consistent with URI for 7 days.  Patient is young, otherwise healthy, normal vital signs, history of physical exam consistent with URI.  Low suspicion for community-acquired pneumonia and do not feel as though  x-ray is needed.  Discussed with patient and he agrees.  While he endorses a diminished appetite, he denies any diarrhea or vomiting.  Lower suspicion for electrolyte derangement.  Do not feel as though laboratory work-up is warranted.  Symptoms are likely of viral etiology.  Discussed that antibiotics are not indicated for viral infections.  Patient will be discharged with symptomatic treatment.  Patient is tolerating food and liquid without difficulty and I do not believe that laboratory work-up would yield any significant findings.  I emphasized the importance of rest, continued oral hydration, and antipyretics as needed for fever control.    They were provided opportunity to ask any additional questions and have none at this time.  Prior to discharge patient is feeling well, agreeable with plan for discharge home.  They have expressed understanding of verbal discharge instructions as well as return precautions and are agreeable to the plan.   ERMA JOUBERT was evaluated in Emergency Department on 05/29/2020 for the symptoms described in the history of present illness. He was evaluated in the context of the global COVID-19 pandemic, which necessitated consideration that the patient might be at risk for infection with the SARS-CoV-2 virus that causes COVID-19. Institutional protocols and algorithms that pertain to the evaluation of patients at risk for COVID-19 are in a state of rapid change based on information released by regulatory bodies including the CDC and federal and state organizations. These policies and algorithms were followed during the patient's care in the ED.   Final Clinical Impression(s) / ED Diagnoses Final diagnoses:  Viral upper respiratory tract infection    Rx / DC Orders ED Discharge Orders    None       Lorelee New, PA-C 05/29/20 1427    Linwood Dibbles, MD 05/30/20 [phone]

## 2020-05-29 NOTE — Discharge Instructions (Signed)
Please be sure to drink plenty of fluids and eat regular meals.  Check your temperature regularly and take Tylenol or ibuprofen as needed for fever control.  I also encourage you to consider ibuprofen 600 mg every 6 hours as needed for body aches.  Your history physical exam is suggestive of viral respiratory infection.  I have prescribed you Tessalon Perles, please take as directed.  I also recommend cough lozenges and warm tea with honey.  You may use your albuterol, as needed.  No wheezing on your exam today.  You will need to follow-up with your primary care provider regarding today's encounter.  If you do not have one, please get established with one as soon as possible.  Return to the ED or seek immediate medical attention should you experience any new or worsening symptoms.

## 2020-10-21 ENCOUNTER — Emergency Department (HOSPITAL_BASED_OUTPATIENT_CLINIC_OR_DEPARTMENT_OTHER)
Admission: EM | Admit: 2020-10-21 | Discharge: 2020-10-21 | Disposition: A | Discharge status: Home / Self Care | Payer: 59 | Attending: Emergency Medicine | Admitting: Emergency Medicine

## 2020-10-21 ENCOUNTER — Other Ambulatory Visit: Payer: Self-pay

## 2020-10-21 ENCOUNTER — Encounter (HOSPITAL_BASED_OUTPATIENT_CLINIC_OR_DEPARTMENT_OTHER): Payer: Self-pay | Admitting: *Deleted

## 2020-10-21 DIAGNOSIS — J069 Acute upper respiratory infection, unspecified: Secondary | ICD-10-CM

## 2020-10-21 DIAGNOSIS — B349 Viral infection, unspecified: Secondary | ICD-10-CM | POA: Diagnosis not present

## 2020-10-21 DIAGNOSIS — Z20822 Contact with and (suspected) exposure to covid-19: Secondary | ICD-10-CM | POA: Diagnosis not present

## 2020-10-21 DIAGNOSIS — R519 Headache, unspecified: Secondary | ICD-10-CM | POA: Diagnosis present

## 2020-10-21 LAB — RESP PANEL BY RT-PCR (FLU A&B, COVID) ARPGX2
Influenza A by PCR: NEGATIVE
Influenza B by PCR: NEGATIVE
SARS Coronavirus 2 by RT PCR: NEGATIVE

## 2020-10-21 MED ORDER — BENZONATATE 200 MG PO CAPS: 200.0000 mg | ORAL_CAPSULE | Freq: Three times a day (TID) | ORAL | 0 refills | Status: DC | PRN

## 2020-10-21 MED ORDER — IBUPROFEN 800 MG PO TABS
800.0000 mg | ORAL_TABLET | Freq: Once | ORAL | Status: AC
  Administered 2020-10-21: 800 mg via ORAL
  Filled 2020-10-21: qty 1

## 2020-10-21 MED ORDER — ALBUTEROL SULFATE HFA 108 (90 BASE) MCG/ACT IN AERS: 2.0000 | INHALATION_SPRAY | RESPIRATORY_TRACT | Status: DC | PRN

## 2020-10-21 MED ORDER — IBUPROFEN 800 MG PO TABS: 800.0000 mg | ORAL_TABLET | Freq: Three times a day (TID) | ORAL | 0 refills | Status: DC

## 2020-10-21 MED ORDER — ACETAMINOPHEN 500 MG PO TABS
1000.0000 mg | ORAL_TABLET | Freq: Once | ORAL | Status: AC
  Administered 2020-10-21: 1000 mg via ORAL
  Filled 2020-10-21: qty 2

## 2020-10-21 NOTE — ED Notes (Signed)
AVS and work note provided to client, also informed client of the two (2) Rx that were electronically sent to his pharmacy on file. EDP asked to provided Albuterol Inhaler (MDI) to client as well. Education on Rx were provided as well. Opportunity for questions provided.

## 2020-10-21 NOTE — ED Triage Notes (Signed)
C/o h/a , body aches , generalized fatigue x 1 day

## 2020-10-21 NOTE — ED Provider Notes (Signed)
[facility] EMERGENCY DEPARTMENT Provider Note   CSN: [id number] Arrival date & time: 10/21/20  1958     History Chief Complaint  Patient presents with  . Headache    EVELYN MOCH is a 25 y.o. male.  Patient presents to the emergency department for evaluation of headache, generalized malaise, myalgias and slight cough.  Symptoms began earlier today.  No nausea, vomiting or diarrhea.        Past Medical History:  Diagnosis Date  . GERD (gastroesophageal reflux disease)     There are no problems to display for this patient.   Past Surgical History:  Procedure Laterality Date  . BACK SURGERY    . tubes in ears    . UMBILICAL HERNIA REPAIR    . Widom teeth extraction         Family History  Problem Relation Age of Onset  . Hypertension Father   . Heart disease Maternal Grandfather   . Hypertension Maternal Grandfather   . Cancer Maternal Grandfather     Social History   Tobacco Use  . Smoking status: Never Smoker  . Smokeless tobacco: Never Used  Vaping Use  . Vaping Use: Never used  Substance Use Topics  . Alcohol use: No  . Drug use: No    Home Medications Prior to Admission medications   Medication Sig Start Date End Date Taking? Authorizing Provider  benzonatate (TESSALON) 200 MG capsule Take 1 capsule (200 mg total) by mouth 3 (three) times daily as needed for cough. 10/21/20  Yes Nickolaos Brallier, Canary Brim, MD  ibuprofen (ADVIL) 800 MG tablet Take 1 tablet (800 mg total) by mouth 3 (three) times daily. 10/21/20  Yes Melo Stauber, Canary Brim, MD  albuterol (PROVENTIL HFA;VENTOLIN HFA) 108 (90 BASE) MCG/ACT inhaler Inhale 1-2 puffs into the lungs every 4 (four) hours as needed for wheezing or shortness of breath. 10/01/13   Deatra Canter, FNP  azithromycin (ZITHROMAX) 250 MG tablet Take 2 po first day and then one po qd x 4 days 10/01/13   Deatra Canter, FNP  doxycycline (VIBRAMYCIN) 100 MG capsule Take 1 capsule (100 mg total) by mouth 2  (two) times daily. 12/13/16   Roxy Horseman, PA-C  naproxen (NAPROSYN) 500 MG tablet Take 1 tablet (500 mg total) by mouth 2 (two) times daily with a meal. As needed for pain 09/02/16   Linwood Dibbles, MD  omeprazole (PRILOSEC) 40 MG capsule Take 1 capsule (40 mg total) by mouth daily. 07/18/13   Daphine Deutscher, Mary-Margaret, FNP  omeprazole (PRILOSEC) 40 MG capsule TAKE 1 CAPSULE (40 MG TOTAL) BY MOUTH DAILY.    Ernestina Penna, MD    Allergies    Patient has no known allergies.  Review of Systems   Review of Systems  Constitutional: Positive for fatigue.  Respiratory: Positive for cough.   Musculoskeletal: Positive for myalgias.  All other systems reviewed and are negative.   Physical Exam Updated Vital Signs BP (!) 152/81   Pulse 76   Temp 98.7 F (37.1 C) (Oral)   Resp 16   Ht 6\' 2"  (1.88 m)   Wt 97.5 kg   SpO2 98%   BMI 27.60 kg/m   Physical Exam Vitals and nursing note reviewed.  Constitutional:      General: He is not in acute distress.    Appearance: Normal appearance. He is well-developed.  HENT:     Head: Normocephalic and atraumatic.     Right Ear: Hearing normal.  Left Ear: Hearing normal.     Nose: Nose normal.  Eyes:     Conjunctiva/sclera: Conjunctivae normal.     Pupils: Pupils are equal, round, and reactive to light.  Cardiovascular:     Rate and Rhythm: Regular rhythm.     Heart sounds: S1 normal and S2 normal. No murmur heard. No friction rub. No gallop.   Pulmonary:     Effort: Pulmonary effort is normal. No respiratory distress.     Breath sounds: Normal breath sounds.  Chest:     Chest wall: No tenderness.  Abdominal:     General: Bowel sounds are normal.     Palpations: Abdomen is soft.     Tenderness: There is no abdominal tenderness. There is no guarding or rebound. Negative signs include Murphy's sign and McBurney's sign.     Hernia: No hernia is present.  Musculoskeletal:        General: Normal range of motion.     Cervical back: Normal  range of motion and neck supple.  Skin:    General: Skin is warm and dry.     Findings: No rash.  Neurological:     Mental Status: He is alert and oriented to person, place, and time.     GCS: GCS eye subscore is 4. GCS verbal subscore is 5. GCS motor subscore is 6.     Cranial Nerves: No cranial nerve deficit.     Sensory: No sensory deficit.     Coordination: Coordination normal.  Psychiatric:        Speech: Speech normal.        Behavior: Behavior normal.        Thought Content: Thought content normal.     ED Results / Procedures / Treatments   Labs (all labs ordered are listed, but only abnormal results are displayed) Labs Reviewed  RESP PANEL BY RT-PCR (FLU A&B, COVID) ARPGX2    EKG None  Radiology No results found.  Procedures Procedures   Medications Ordered in ED Medications  ibuprofen (ADVIL) tablet 800 mg (has no administration in time range)  acetaminophen (TYLENOL) tablet 1,000 mg (has no administration in time range)    ED Course  I have reviewed the triage vital signs and the nursing notes.  Pertinent labs & imaging results that were available during my care of the patient were reviewed by me and considered in my medical decision making (see chart for details).    MDM Rules/Calculators/A&P                          Patient presents to the emergency department for evaluation of flulike symptoms.  Symptoms began approximately 12 hours ago.  He appears well on exam.  Vital signs are normal other than slight hypertension.  No fever or hypoxia.  Lungs are clear, no clinical concern for pneumonia.  Neck is supple, no signs of meningismus.  Flu and COVID-negative.  Treat for likely viral illness symptomatically.  Final Clinical Impression(s) / ED Diagnoses Final diagnoses:  Upper respiratory tract infection, unspecified type  Viral illness    Rx / DC Orders ED Discharge Orders         Ordered    benzonatate (TESSALON) 200 MG capsule  3 times daily PRN         10/21/20 2308    ibuprofen (ADVIL) 800 MG tablet  3 times daily        10/21/20 2308  Gilda Crease, MD 10/21/20 2308

## 2020-12-14 DIAGNOSIS — S161XXA Strain of muscle, fascia and tendon at neck level, initial encounter: Secondary | ICD-10-CM | POA: Diagnosis not present

## 2021-02-05 DIAGNOSIS — Z20822 Contact with and (suspected) exposure to covid-19: Secondary | ICD-10-CM | POA: Diagnosis not present

## 2021-02-12 DIAGNOSIS — Z6827 Body mass index (BMI) 27.0-27.9, adult: Secondary | ICD-10-CM | POA: Diagnosis not present

## 2021-02-12 DIAGNOSIS — J40 Bronchitis, not specified as acute or chronic: Secondary | ICD-10-CM | POA: Diagnosis not present

## 2021-02-16 DIAGNOSIS — R1110 Vomiting, unspecified: Secondary | ICD-10-CM | POA: Diagnosis not present

## 2021-02-16 DIAGNOSIS — R197 Diarrhea, unspecified: Secondary | ICD-10-CM | POA: Diagnosis not present

## 2021-02-23 ENCOUNTER — Encounter (HOSPITAL_BASED_OUTPATIENT_CLINIC_OR_DEPARTMENT_OTHER): Payer: Self-pay | Admitting: Emergency Medicine

## 2021-02-23 ENCOUNTER — Other Ambulatory Visit: Payer: Self-pay

## 2021-02-23 ENCOUNTER — Emergency Department (HOSPITAL_BASED_OUTPATIENT_CLINIC_OR_DEPARTMENT_OTHER)
Admission: EM | Admit: 2021-02-23 | Discharge: 2021-02-23 | Disposition: A | Discharge status: Home / Self Care | Payer: BC Managed Care – PPO | Attending: Emergency Medicine | Admitting: Emergency Medicine

## 2021-02-23 ENCOUNTER — Emergency Department (HOSPITAL_BASED_OUTPATIENT_CLINIC_OR_DEPARTMENT_OTHER): Payer: BC Managed Care – PPO

## 2021-02-23 DIAGNOSIS — R3915 Urgency of urination: Secondary | ICD-10-CM | POA: Diagnosis not present

## 2021-02-23 DIAGNOSIS — R1013 Epigastric pain: Secondary | ICD-10-CM | POA: Insufficient documentation

## 2021-02-23 DIAGNOSIS — R6883 Chills (without fever): Secondary | ICD-10-CM | POA: Insufficient documentation

## 2021-02-23 DIAGNOSIS — R1084 Generalized abdominal pain: Secondary | ICD-10-CM | POA: Insufficient documentation

## 2021-02-23 DIAGNOSIS — M549 Dorsalgia, unspecified: Secondary | ICD-10-CM | POA: Diagnosis not present

## 2021-02-23 DIAGNOSIS — R197 Diarrhea, unspecified: Secondary | ICD-10-CM | POA: Diagnosis not present

## 2021-02-23 DIAGNOSIS — R112 Nausea with vomiting, unspecified: Secondary | ICD-10-CM | POA: Insufficient documentation

## 2021-02-23 LAB — URINALYSIS, ROUTINE W REFLEX MICROSCOPIC
Bilirubin Urine: NEGATIVE
Glucose, UA: NEGATIVE mg/dL
Hgb urine dipstick: NEGATIVE
Ketones, ur: NEGATIVE mg/dL
Leukocytes,Ua: NEGATIVE
Nitrite: NEGATIVE
Specific Gravity, Urine: 1.024 (ref 1.005–1.030)
pH: 6.5 (ref 5.0–8.0)

## 2021-02-23 LAB — CBC WITH DIFFERENTIAL/PLATELET
Abs Immature Granulocytes: 0.04 10*3/uL (ref 0.00–0.07)
Basophils Absolute: 0.1 10*3/uL (ref 0.0–0.1)
Basophils Relative: 1 %
Eosinophils Absolute: 0.2 10*3/uL (ref 0.0–0.5)
Eosinophils Relative: 3 %
HCT: 42.7 % (ref 39.0–52.0)
Hemoglobin: 14.7 g/dL (ref 13.0–17.0)
Immature Granulocytes: 1 %
Lymphocytes Relative: 33 %
Lymphs Abs: 2.7 10*3/uL (ref 0.7–4.0)
MCH: 32 pg (ref 26.0–34.0)
MCHC: 34.4 g/dL (ref 30.0–36.0)
MCV: 92.8 fL (ref 80.0–100.0)
Monocytes Absolute: 0.8 10*3/uL (ref 0.1–1.0)
Monocytes Relative: 10 %
Neutro Abs: 4.3 10*3/uL (ref 1.7–7.7)
Neutrophils Relative %: 52 %
Platelets: 318 10*3/uL (ref 150–400)
RBC: 4.6 MIL/uL (ref 4.22–5.81)
RDW: 13.1 % (ref 11.5–15.5)
WBC: 8.1 10*3/uL (ref 4.0–10.5)
nRBC: 0 % (ref 0.0–0.2)

## 2021-02-23 LAB — COMPREHENSIVE METABOLIC PANEL
ALT: 32 U/L (ref 0–44)
AST: 15 U/L (ref 15–41)
Albumin: 4.3 g/dL (ref 3.5–5.0)
Alkaline Phosphatase: 61 U/L (ref 38–126)
Anion gap: 8 (ref 5–15)
BUN: 10 mg/dL (ref 6–20)
CO2: 26 mmol/L (ref 22–32)
Calcium: 8.9 mg/dL (ref 8.9–10.3)
Chloride: 105 mmol/L (ref 98–111)
Creatinine, Ser: 0.97 mg/dL (ref 0.61–1.24)
GFR, Estimated: >60 mL/min (ref >60)
Glucose, Bld: 91 mg/dL (ref 70–99)
Potassium: 4 mmol/L (ref 3.5–5.1)
Sodium: 139 mmol/L (ref 135–145)
Total Bilirubin: 0.5 mg/dL (ref 0.3–1.2)
Total Protein: 6.7 g/dL (ref 6.5–8.1)

## 2021-02-23 LAB — LIPASE, BLOOD: Lipase: 38 U/L (ref 11–51)

## 2021-02-23 MED ORDER — IOHEXOL 350 MG/ML SOLN
100.0000 mL | Freq: Once | INTRAVENOUS | Status: AC | PRN
  Administered 2021-02-23: 75 mL via INTRAVENOUS

## 2021-02-23 MED ORDER — ONDANSETRON 4 MG PO TBDP: 4.0000 mg | ORAL_TABLET | Freq: Three times a day (TID) | ORAL | 0 refills | Status: DC | PRN

## 2021-02-23 NOTE — Discharge Instructions (Addendum)
Your blood work and urine tests today were reassuring that you don't have an infection. Your CT scan showed no evidence of a kidney stone.  I think your symptoms are likely part of a viral GI infection. I'm attaching instructions for a bland diet to follow for the next several days. You can continue taking over the counter medications for pain as well as an antacid medicine like pepcid or prilosec prior to eating.   Continue to monitor your symptoms and return to ED for new or worsening abdominal pain, or persistent vomiting or diarrhea.

## 2021-02-23 NOTE — ED Provider Notes (Signed)
[facility] EMERGENCY DEPT Provider Note   CSN: [id number] Arrival date & time: 02/23/21  1617     History Chief Complaint  Patient presents with   Back Pain    Frank Booth is a 25 y.o. male who presents with abdominal pain and flank pain x 5 days.  His pain started gradually and originates in his right flank, radiating to his generalized abdomen.  He is complaining of associated chills, nausea, intermittent diarrhea, and increased urinary urgency.  His pain is worse with eating and exertion. Has been trying tylenol without relief. He did have 1 episode of pain originating in his right side that radiated down to his right toe with associated numbness and tingling. He has history of sciatica. Denies falls or recent injuries. Patient was dx with bronchitis and got antibiotics and steroid taper 3 weeks ago, complaining of intermittent vomiting and diarrhea since. He is also sober from alcohol x3 weeks.  No smoking, no IV drug use.  The history is provided by the patient.  Back Pain Associated symptoms: abdominal pain   Associated symptoms: no chest pain, no dysuria and no fever       Past Medical History:  Diagnosis Date   GERD (gastroesophageal reflux disease)     There are no problems to display for this patient.   Past Surgical History:  Procedure Laterality Date   BACK SURGERY     tubes in ears     UMBILICAL HERNIA REPAIR     Widom teeth extraction         Family History  Problem Relation Age of Onset   Hypertension Father    Heart disease Maternal Grandfather    Hypertension Maternal Grandfather    Cancer Maternal Grandfather     Social History   Tobacco Use   Smoking status: Never   Smokeless tobacco: Never  Vaping Use   Vaping Use: Never used  Substance Use Topics   Alcohol use: No   Drug use: No    Home Medications Prior to Admission medications   Medication Sig Start Date End Date Taking? Authorizing Provider  ondansetron  (ZOFRAN ODT) 4 MG disintegrating tablet Take 1 tablet (4 mg total) by mouth every 8 (eight) hours as needed for nausea or vomiting. 02/23/21  Yes Little Bashore T, PA-C  albuterol (PROVENTIL HFA;VENTOLIN HFA) 108 (90 BASE) MCG/ACT inhaler Inhale 1-2 puffs into the lungs every 4 (four) hours as needed for wheezing or shortness of breath. 10/01/13   Deatra Canter, FNP  azithromycin (ZITHROMAX) 250 MG tablet Take 2 po first day and then one po qd x 4 days 10/01/13   Deatra Canter, FNP  benzonatate (TESSALON) 200 MG capsule Take 1 capsule (200 mg total) by mouth 3 (three) times daily as needed for cough. 10/21/20   Gilda Crease, MD  doxycycline (VIBRAMYCIN) 100 MG capsule Take 1 capsule (100 mg total) by mouth 2 (two) times daily. 12/13/16   Roxy Horseman, PA-C  ibuprofen (ADVIL) 800 MG tablet Take 1 tablet (800 mg total) by mouth 3 (three) times daily. 10/21/20   Gilda Crease, MD  naproxen (NAPROSYN) 500 MG tablet Take 1 tablet (500 mg total) by mouth 2 (two) times daily with a meal. As needed for pain 09/02/16   Linwood Dibbles, MD  omeprazole (PRILOSEC) 40 MG capsule Take 1 capsule (40 mg total) by mouth daily. 07/18/13   Daphine Deutscher, Mary-Margaret, FNP  omeprazole (PRILOSEC) 40 MG capsule TAKE 1 CAPSULE (40 MG TOTAL)  BY MOUTH DAILY.    Ernestina Penna, MD    Allergies    Patient has no known allergies.  Review of Systems   Review of Systems  Constitutional:  Positive for chills. Negative for fever.  Respiratory:  Negative for shortness of breath.   Cardiovascular:  Negative for chest pain.  Gastrointestinal:  Positive for abdominal pain, diarrhea, nausea and vomiting. Negative for blood in stool and constipation.  Genitourinary:  Positive for flank pain and urgency. Negative for dysuria and hematuria.  Musculoskeletal:  Positive for back pain. Negative for neck pain.  All other systems reviewed and are negative.  Physical Exam Updated Vital Signs BP 130/84 (BP Location: Right  Arm)   Pulse (!) 55   Temp 98.4 F (36.9 C)   Resp 17   Ht 6\' 2"  (1.88 m)   Wt 93 kg   SpO2 99%   BMI 26.32 kg/m   Physical Exam Vitals and nursing note reviewed.  Constitutional:      Appearance: Normal appearance.  HENT:     Head: Normocephalic and atraumatic.  Eyes:     Conjunctiva/sclera: Conjunctivae normal.  Cardiovascular:     Rate and Rhythm: Normal rate and regular rhythm.  Pulmonary:     Effort: Pulmonary effort is normal. No respiratory distress.     Breath sounds: Normal breath sounds.  Abdominal:     General: There is no distension.     Palpations: Abdomen is soft.     Tenderness: There is abdominal tenderness in the epigastric area. There is right CVA tenderness. There is no guarding or rebound.  Skin:    General: Skin is warm and dry.  Neurological:     General: No focal deficit present.     Mental Status: He is alert.    ED Results / Procedures / Treatments   Labs (all labs ordered are listed, but only abnormal results are displayed) Labs Reviewed  URINALYSIS, ROUTINE W REFLEX MICROSCOPIC - Abnormal; Notable for the following components:      Result Value   Protein, ur TRACE (*)    All other components within normal limits  CBC WITH DIFFERENTIAL/PLATELET  COMPREHENSIVE METABOLIC PANEL  LIPASE, BLOOD    EKG None  Radiology CT ABDOMEN PELVIS W CONTRAST  Result Date: 02/23/2021 CLINICAL DATA:  Acute abdominal pain. EXAM: CT ABDOMEN AND PELVIS WITH CONTRAST TECHNIQUE: Multidetector CT imaging of the abdomen and pelvis was performed using the standard protocol following bolus administration of intravenous contrast. CONTRAST:  61mL OMNIPAQUE IOHEXOL 350 MG/ML SOLN COMPARISON:  CT abdomen and pelvis 02/02/2014. FINDINGS: Lower chest: No acute abnormality. Hepatobiliary: No focal liver abnormality is seen. No gallstones, gallbladder wall thickening, or biliary dilatation. Pancreas: Unremarkable. No pancreatic ductal dilatation or surrounding inflammatory  changes. Spleen: Normal in size without focal abnormality. Adrenals/Urinary Tract: Hypodensity in the right kidney is too small to characterize and unchanged, likely a cyst. The kidneys otherwise appear within normal limits. The bladder and adrenal glands are within normal limits. Stomach/Bowel: Stomach is within normal limits. Appendix appears normal. No evidence of bowel wall thickening, distention, or inflammatory changes. Vascular/Lymphatic: No significant vascular findings are present. No enlarged abdominal or pelvic lymph nodes. Reproductive: Prostate is unremarkable. Other: No abdominal wall hernia or abnormality. No abdominopelvic ascites. Musculoskeletal: No acute or significant osseous findings. IMPRESSION: 1. No acute localizing process in the abdomen or pelvis. Electronically Signed   By: 02/04/2014 M.D.   On: 02/23/2021 22:02    Procedures Procedures  Medications Ordered in ED Medications  iohexol (OMNIPAQUE) 350 MG/ML injection 100 mL (75 mLs Intravenous Contrast Given 02/23/21 2139)    ED Course  I have reviewed the triage vital signs and the nursing notes.  Pertinent labs & imaging results that were available during my care of the patient were reviewed by me and considered in my medical decision making (see chart for details).    MDM Rules/Calculators/A&P                           Patient is 25 y/o male presenting with abdominal pain and R flank pain x 5 days. Associated chills, nausea with intermittent vomiting and diarrhea, increased urinary urgency. On exam patient is afebrile, and has tenderness to palpation of epigastrium and R CVA tenderness. Differential to include nephrolithiasis, appendicitis, pancreatitis, gastroenteritis.   CBC shows no leukocytosis. Urinalysis shows no blood or signs of infection. CMP unremarkable. Lipase normal. CT abdomen/pelvis normal. Patient's symptoms are likely due to viral gastroenteritis. He is hemodynamically stable and does not require  admission or inpatient treatment for his symptoms at this time. Writing prescription for zofran. Discussed reasons to return to ED. Patient agreeable to plan.   Final Clinical Impression(s) / ED Diagnoses Final diagnoses:  Generalized abdominal pain    Rx / DC Orders ED Discharge Orders          Ordered    ondansetron (ZOFRAN ODT) 4 MG disintegrating tablet  Every 8 hours PRN        02/23/21 2217             Bettymae Yott T, PA-C 02/23/21 2218    Margarita Grizzle, MD 02/24/21 [phone]

## 2021-02-23 NOTE — ED Triage Notes (Signed)
Pt arrives to ED with c/o of lower back pain that radiates to right leg. Pt reports the leg pain is a shooting pain. The started x7 days ago. The pain is constant. Tylenol OTC does not alleviate pain. Pt with hx of sciatic nerve pain. No dysuria, hematuria, CP, or SOB.

## 2021-07-08 DIAGNOSIS — J4520 Mild intermittent asthma, uncomplicated: Secondary | ICD-10-CM | POA: Insufficient documentation

## 2021-07-08 DIAGNOSIS — Z9889 Other specified postprocedural states: Secondary | ICD-10-CM | POA: Insufficient documentation

## 2021-07-12 ENCOUNTER — Other Ambulatory Visit: Payer: Self-pay

## 2021-07-12 ENCOUNTER — Encounter: Payer: Self-pay | Admitting: Orthopaedic Surgery

## 2021-07-12 ENCOUNTER — Ambulatory Visit (INDEPENDENT_AMBULATORY_CARE_PROVIDER_SITE_OTHER): Payer: BC Managed Care – PPO | Admitting: Orthopaedic Surgery

## 2021-07-12 ENCOUNTER — Ambulatory Visit: Payer: Self-pay

## 2021-07-12 VITALS — BP 113/75 | HR 77 | Ht 74.0 in | Wt 215.0 lb

## 2021-07-12 DIAGNOSIS — M542 Cervicalgia: Secondary | ICD-10-CM | POA: Diagnosis not present

## 2021-07-12 MED ORDER — CYCLOBENZAPRINE HCL 10 MG PO TABS: 10.0000 mg | ORAL_TABLET | Freq: Three times a day (TID) | ORAL | 0 refills | Status: DC | PRN

## 2021-07-12 NOTE — Progress Notes (Addendum)
Office Visit Note   Patient: Frank Booth           Date of Birth: [birth year]           MRN: [id number] Visit Date: 07/12/2021              Requested by: Bennie Pierini, FNP [id number] [place],  [place] [address] PCP: Pcp, No   Assessment & Plan: Visit Diagnoses:  1. Neck pain   2.      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  Plan: I will send in some Flexeril that he can take 3 times a day but I will not take it if he is working or driving or operating machinery.  Work slip given no work for yesterday today and tomorrow he can resume work on 07/14/2021.  Plan to recheck in 4 weeks.  We discussed the mild spurring at C5-6 and he states he remembers at the time of surgery was told he had a little bit of disc degeneration at C5-6.  Recheck 4 weeks.  Follow-Up Instructions: Return in about 4 weeks (around 08/09/2021).   Orders:  Orders Placed This Encounter  Procedures   XR Cervical Spine 2 or 3 views   Meds ordered this encounter  Medications   cyclobenzaprine (FLEXERIL) 10 MG tablet    Sig: Take 1 tablet (10 mg total) by mouth 3 (three) times daily as needed for muscle spasms.    Dispense:  30 tablet    Refill:  0   cyclobenzaprine (FLEXERIL) 10 MG tablet    Sig: Take 1 tablet (10 mg total) by mouth 3 (three) times daily as needed for muscle spasms.    Dispense:  30 tablet    Refill:  0      Procedures: No procedures performed   Clinical Data: No additional findings.   Subjective: Chief Complaint  Patient presents with   Neck - Pain    HPI 26 year old male with neck pain x1 to 2 weeks and states he got worse recently and missed work yesterday and did not work today.  He has had posterior neck pain previous history of disc arthroplasty on 09/09/2018 done by [facility] in [place].  Patient has the records at home.  He has been on Naprosyn.  He took a Medrol Dosepak by his PCP states it really did not help.  He is right-hand dominant he  has not noticed any weakness in his hand but states he did notice a little bit of difficulty making a complete fist with his left hand.  No gait problems.  He was seen in emergency room 2021 with problems with neck pain short time.  That resolved he was treated with Flexeril at that time.  No myelopathic symptoms.  Past medical history includes a mover back when he had disc arthroplasty surgery.  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  Review of Systems 14 point systems otherwise noncontributory.   Objective: Vital Signs: BP 113/75    Pulse 77    Ht 6\' 2"  (1.88 m)    Wt 215 lb (97.5 kg)    BMI 27.60 kg/m   Physical Exam Constitutional:      Appearance: He is well-developed.  HENT:     Head: Normocephalic and atraumatic.     Right Ear: External ear normal.     Left Ear: External ear normal.  Eyes:     Pupils: Pupils are equal, round, and reactive to light.  Neck:  Thyroid: No thyromegaly.     Trachea: No tracheal deviation.  Cardiovascular:     Rate and Rhythm: Normal rate.  Pulmonary:     Effort: Pulmonary effort is normal.     Breath sounds: No wheezing.  Abdominal:     General: Bowel sounds are normal.     Palpations: Abdomen is soft.  Musculoskeletal:     Cervical back: Neck supple.  Skin:    General: Skin is warm and dry.     Capillary Refill: Capillary refill takes less than 2 seconds.  Neurological:     Mental Status: He is alert and oriented to person, place, and time.  Psychiatric:        Behavior: Behavior normal.        Thought Content: Thought content normal.        Judgment: Judgment normal.    Ortho Exam no brachial plexus tenderness mild neck posterior cervical pain with neck rotation.  Slight discomfort with both compression and distraction.  Good flexion chin to chest.  Upper extremity reflexes are 2+ and symmetrical biceps triceps brachial radialis no atrophy no rash.  Sensation hand is normal.  Normal heel toe gait.  Specialty Comments:  No  specialty comments available.  Imaging: XR Cervical Spine 2 or 3 views  Result Date: 07/12/2021 AP lateral cervical spine images were obtained and reviewed.  This shows cervical disc arthroplasty at C6-7.  Small posterior endplate spurs are noted at C6-7 at the operative level.  No loosening or subsidence of the disc arthroplasty.  Small posterior spurs noted at C5-6. Impression: Post disc arthroplasty with endplate spurs as noted above.    PMFS History: There are no problems to display for this patient.  Past Medical History:  Diagnosis Date   GERD (gastroesophageal reflux disease)     Family History  Problem Relation Age of Onset   Hypertension Father    Heart disease Maternal Grandfather    Hypertension Maternal Grandfather    Cancer Maternal Grandfather     Past Surgical History:  Procedure Laterality Date   BACK SURGERY     tubes in ears     UMBILICAL HERNIA REPAIR     Widom teeth extraction     Social History   Occupational History   Not on file  Tobacco Use   Smoking status: Never   Smokeless tobacco: Never  Vaping Use   Vaping Use: Never used  Substance and Sexual Activity   Alcohol use: No   Drug use: No   Sexual activity: Yes

## 2021-08-09 ENCOUNTER — Ambulatory Visit: Payer: BC Managed Care – PPO | Admitting: Orthopaedic Surgery

## 2021-08-17 ENCOUNTER — Ambulatory Visit (INDEPENDENT_AMBULATORY_CARE_PROVIDER_SITE_OTHER): Payer: BC Managed Care – PPO | Admitting: Orthopaedic Surgery

## 2021-08-17 ENCOUNTER — Encounter: Payer: Self-pay | Admitting: Orthopaedic Surgery

## 2021-08-17 ENCOUNTER — Other Ambulatory Visit: Payer: Self-pay

## 2021-08-17 VITALS — BP 122/77 | HR 58 | Ht 74.0 in | Wt 215.0 lb

## 2021-08-17 DIAGNOSIS — M542 Cervicalgia: Secondary | ICD-10-CM

## 2021-08-17 NOTE — Progress Notes (Signed)
? ?Office Visit Note ?  ?Patient: Frank Booth           ?Date of Birth: 09-18-1995           ?MRN: [id number] ?Visit Date: 08/17/2021 ?             ?Requested by: No referring provider defined for this encounter. ?PCP: Pcp, No ? ? ?Assessment & Plan: ?Visit Diagnoses: No diagnosis found. ? ?Plan: Patient had some mild spurring at C5-6 above his previous disc arthroplasty which looks good on radiographs.  We will set him up for some therapy and recheck him on follow-up if he has persistent symptoms then we can proceed with diagnostic imaging studies. ? ?Follow-Up Instructions: No follow-ups on file.  ? ?Orders:  ?No orders of the defined types were placed in this encounter. ? ?No orders of the defined types were placed in this encounter. ? ? ? ? Procedures: ?No procedures performed ? ? ?Clinical Data: ?No additional findings. ? ? ?Subjective: ?Chief Complaint  ?Patient presents with  ? Neck - Pain, Follow-up  ? ? ?HPI 26 year old male with history of C6-7 disc arthroplasty done [facility] at [facility].  He returns and has ongoing pain in his neck aching tickly when he looks up.  He is using Flexeril to help him sleep and also Naprosyn.  Patient is right-hand dominant.  He has had a Medrol Dosepak which did not help.  No weakness in his hand.  No gait disturbance. ? ?Review of Systems all other systems noncontributory to HPI. ? ? ?Objective: ?Vital Signs: BP 122/77   Pulse (!) 58   Ht 6\' 2"  (1.88 m)   Wt 215 lb (97.5 kg)   BMI 27.60 kg/m?  ? ?Physical Exam ?Constitutional:   ?   Appearance: He is well-developed.  ?HENT:  ?   Head: Normocephalic and atraumatic.  ?   Right Ear: External ear normal.  ?   Left Ear: External ear normal.  ?Eyes:  ?   Pupils: Pupils are equal, round, and reactive to light.  ?Neck:  ?   Thyroid: No thyromegaly.  ?   Trachea: No tracheal deviation.  ?Cardiovascular:  ?   Rate and Rhythm: Normal rate.  ?Pulmonary:  ?   Effort: Pulmonary effort is normal.  ?    Breath sounds: No wheezing.  ?Abdominal:  ?   General: Bowel sounds are normal.  ?   Palpations: Abdomen is soft.  ?Musculoskeletal:  ?   Cervical back: Neck supple.  ?Skin: ?   General: Skin is warm and dry.  ?   Capillary Refill: Capillary refill takes less than 2 seconds.  ?Neurological:  ?   Mental Status: He is alert and oriented to person, place, and time.  ?Psychiatric:     ?   Behavior: Behavior normal.     ?   Thought Content: Thought content normal.     ?   Judgment: Judgment normal.  ? ? ?Ortho Exam patient's of brachial plexus tenderness discomfort with neck rotation increased discomfort with both compression also with distraction.  He can flex chin to chest.  Reflexes are symmetrical normal lower extremity gait no hyperreflexia of the lower extremities no clonus. ? ?Specialty Comments:  ?No specialty comments available. ? ?Imaging: ?No results found. ? ? ?PMFS History: ?There are no problems to display for this patient. ? ?Past Medical History:  ?Diagnosis Date  ? GERD (gastroesophageal reflux disease)   ?  ?Family History  ?  Problem Relation Age of Onset  ? Hypertension Father   ? Heart disease Maternal Grandfather   ? Hypertension Maternal Grandfather   ? Cancer Maternal Grandfather   ?  ?Past Surgical History:  ?Procedure Laterality Date  ? BACK SURGERY    ? tubes in ears    ? UMBILICAL HERNIA REPAIR    ? Widom teeth extraction    ? ?Social History  ? ?Occupational History  ? Not on file  ?Tobacco Use  ? Smoking status: Never  ? Smokeless tobacco: Never  ?Vaping Use  ? Vaping Use: Never used  ?Substance and Sexual Activity  ? Alcohol use: No  ? Drug use: No  ? Sexual activity: Yes  ? ? ? ? ? ? ?

## 2021-10-26 ENCOUNTER — Encounter: Payer: Self-pay | Admitting: Family Medicine

## 2021-10-26 ENCOUNTER — Emergency Department (INDEPENDENT_AMBULATORY_CARE_PROVIDER_SITE_OTHER)
Admission: EM | Admit: 2021-10-26 | Discharge: 2021-10-26 | Disposition: A | Discharge status: Home / Self Care | Payer: Self-pay | Source: Home / Self Care

## 2021-10-26 DIAGNOSIS — K219 Gastro-esophageal reflux disease without esophagitis: Secondary | ICD-10-CM

## 2021-10-26 MED ORDER — OMEPRAZOLE 40 MG PO CPDR: DELAYED_RELEASE_CAPSULE | ORAL | 0 refills | Status: DC

## 2021-10-26 NOTE — ED Provider Notes (Signed)
?[facility] ? ? ? ?CSN: [id number] ?Arrival date & time: 10/26/21  1859 ? ? ?  ? ?History   ?Chief Complaint ?Chief Complaint  ?Patient presents with  ? Abdominal Pain  ? ? ?HPI ?Frank Booth is a 26 y.o. male.  ? ?HPI 26 year old male presents with abdominal/epigastric pain for 1 days.  PMH significant for umbilical hernia repair, GERD, and previous back surgery.  Patient reports not taking previously prescribed Prilosec 40 mg daily. CT of abdomen pelvis with contrast performed on 02/23/2021 revealed no acute localizing process in the abdomen or pelvis ? ?Past Medical History:  ?Diagnosis Date  ? GERD (gastroesophageal reflux disease)   ? ? ?There are no problems to display for this patient. ? ? ?Past Surgical History:  ?Procedure Laterality Date  ? BACK SURGERY    ? tubes in ears    ? UMBILICAL HERNIA REPAIR    ? Widom teeth extraction    ? ? ? ? ? ?Home Medications   ? ?Prior to Admission medications   ?Medication Sig Start Date End Date Taking? Authorizing Provider  ?omeprazole (PRILOSEC) 40 MG capsule Take 1 tab PO daily x 30 days 10/26/21  Yes Eliezer Lofts, FNP  ?albuterol (PROVENTIL HFA;VENTOLIN HFA) 108 (90 BASE) MCG/ACT inhaler Inhale 1-2 puffs into the lungs every 4 (four) hours as needed for wheezing or shortness of breath. ?Patient not taking: Reported on 07/12/2021 10/01/13   Lysbeth Penner, FNP  ?azithromycin (ZITHROMAX) 250 MG tablet Take 2 po first day and then one po qd x 4 days ?Patient not taking: Reported on 07/12/2021 10/01/13   Lysbeth Penner, FNP  ?benzonatate (TESSALON) 200 MG capsule Take 1 capsule (200 mg total) by mouth 3 (three) times daily as needed for cough. ?Patient not taking: Reported on 07/12/2021 10/21/20   Orpah Greek, MD  ?cyclobenzaprine (FLEXERIL) 10 MG tablet Take 1 tablet (10 mg total) by mouth 3 (three) times daily as needed for muscle spasms. ?Patient not taking: Reported on 10/26/2021 07/12/21   Marybelle Killings, MD  ?cyclobenzaprine (FLEXERIL) 10 MG  tablet Take 1 tablet (10 mg total) by mouth 3 (three) times daily as needed for muscle spasms. ?Patient not taking: Reported on 10/26/2021 07/12/21   Marybelle Killings, MD  ?doxycycline (VIBRAMYCIN) 100 MG capsule Take 1 capsule (100 mg total) by mouth 2 (two) times daily. ?Patient not taking: Reported on 07/12/2021 12/13/16   Montine Circle, PA-C  ?ibuprofen (ADVIL) 800 MG tablet Take 1 tablet (800 mg total) by mouth 3 (three) times daily. ?Patient not taking: Reported on 07/12/2021 10/21/20   Orpah Greek, MD  ?methylPREDNISolone (MEDROL DOSEPAK) 4 MG TBPK tablet Take by mouth as directed. ?Patient not taking: Reported on 07/12/2021 07/04/21   [provider]  ?naproxen (NAPROSYN) 500 MG tablet Take 1 tablet (500 mg total) by mouth 2 (two) times daily with a meal. As needed for pain ?Patient not taking: Reported on 10/26/2021 09/02/16   Dorie Rank, MD  ?ondansetron (ZOFRAN ODT) 4 MG disintegrating tablet Take 1 tablet (4 mg total) by mouth every 8 (eight) hours as needed for nausea or vomiting. ?Patient not taking: Reported on 07/12/2021 02/23/21   Roemhildt, Lorin T, PA-C  ? ? ?Family History ?Family History  ?Problem Relation Age of Onset  ? Hypertension Father   ? Heart disease Maternal Grandfather   ? Hypertension Maternal Grandfather   ? Cancer Maternal Grandfather   ? ? ?Social History ?Social History  ? ?Tobacco Use  ?  Smoking status: Never  ? Smokeless tobacco: Never  ?Vaping Use  ? Vaping Use: Never used  ?Substance Use Topics  ? Alcohol use: No  ? Drug use: No  ? ? ? ?Allergies   ?Patient has no known allergies. ? ? ?Review of Systems ?Review of Systems  ?Gastrointestinal:  Positive for abdominal pain.  ?All other systems reviewed and are negative. ? ? ?Physical Exam ?Triage Vital Signs ?ED Triage Vitals  ?Enc Vitals Group  ?   BP   ?   Pulse   ?   Resp   ?   Temp   ?   Temp src   ?   SpO2   ?   Weight   ?   Height   ?   Head Circumference   ?   Peak Flow   ?   Pain Score   ?   Pain Loc   ?   Pain  Edu?   ?   Excl. in [place]?   ? ?No data found. ? ?Updated Vital Signs ?BP 132/85 (BP Location: Left Arm)   Pulse 86   Temp 99 ?F (37.2 ?C) (Oral)   Resp 16   Ht 6\' 2"  (1.88 m)   Wt 217 lb (98.4 kg)   SpO2 98%   BMI 27.86 kg/m?  ? ?  ? ?Physical Exam ?Vitals and nursing note reviewed.  ?Constitutional:   ?   General: He is not in acute distress. ?   Appearance: Normal appearance. He is normal weight. He is not ill-appearing.  ?HENT:  ?   Head: Normocephalic and atraumatic.  ?   Mouth/Throat:  ?   Mouth: Mucous membranes are moist.  ?   Pharynx: Oropharynx is clear.  ?Eyes:  ?   Extraocular Movements: Extraocular movements intact.  ?   Conjunctiva/sclera: Conjunctivae normal.  ?   Pupils: Pupils are equal, round, and reactive to light.  ?Cardiovascular:  ?   Rate and Rhythm: Normal rate and regular rhythm.  ?   Pulses: Normal pulses.  ?Pulmonary:  ?   Effort: Pulmonary effort is normal.  ?   Breath sounds: Normal breath sounds. No wheezing, rhonchi or rales.  ?Abdominal:  ?   General: Abdomen is flat. Bowel sounds are absent. There is no distension.  ?   Palpations: There is no shifting dullness, fluid wave, hepatomegaly, splenomegaly, mass or pulsatile mass.  ?   Tenderness: There is no right CVA tenderness, guarding or rebound. Negative signs include Murphy's sign, McBurney's sign and psoas sign.  ?   Hernia: No hernia is present.  ?Musculoskeletal:  ?   Cervical back: Normal range of motion and neck supple.  ?Skin: ?   General: Skin is warm and dry.  ?Neurological:  ?   General: No focal deficit present.  ?   Mental Status: He is alert and oriented to person, place, and time.  ? ? ? ?UC Treatments / Results  ?Labs ?(all labs ordered are listed, but only abnormal results are displayed) ?Labs Reviewed - No data to display ? ?EKG ? ? ?Radiology ?No results found. ? ?Procedures ?Procedures (including critical care time) ? ?Medications Ordered in UC ?Medications - No data to display ? ?Initial Impression /  Assessment and Plan / UC Course  ?I have reviewed the triage vital signs and the nursing notes. ? ?Pertinent labs & imaging results that were available during my care of the patient were reviewed by me and considered in my medical decision  making (see chart for details). ? ?  ? ?MDM: 1.  GERD, unspecified whether esophagitis present-Rx'd Omeprazole. Advised patient to take medication as directed to completion for the next 30 days.  Advised patient to take medication first thing in the morning on empty stomach with 8 ounces of water and no other medication or foods for at least 45 minutes.  Encouraged patient to increase daily water intake while taking this medication.  Encouraged patient to avoid GERD triggering foods and beverages.  Advised patient if symptoms worsen and/or unresolved please follow-up with PCP or here for further evaluation.  Patient discharged home, hemodynamically stable ?Final Clinical Impressions(s) / UC Diagnoses  ? ?Final diagnoses:  ?Gastroesophageal reflux disease, unspecified whether esophagitis present  ? ? ? ?Discharge Instructions   ? ?  ?Advised patient to take medication as directed to completion for the next 30 days.  Advised patient to take medication first thing in the morning on empty stomach with 8 ounces of water and no other medication or foods for at least 45 minutes.  Encouraged patient to increase daily water intake while taking this medication.  Encouraged patient to avoid GERD triggering foods and beverages.  Advised patient if symptoms worsen and/or unresolved please follow-up with PCP or here for further evaluation. ? ? ? ? ?ED Prescriptions   ? ? Medication Sig Dispense Auth. Provider  ? omeprazole (PRILOSEC) 40 MG capsule Take 1 tab PO daily x 30 days 30 capsule Eliezer Lofts, FNP  ? ?  ? ?PDMP not reviewed this encounter. ?  ?Eliezer Lofts, [place] ?10/26/21 1941 ? ?

## 2021-10-26 NOTE — ED Triage Notes (Signed)
Abdominal/epigastric pain  started yesterday at 3pm  ?Chills at 0300 ?Describes pain as sharp - radiates to back  ?OTC - gas X, Tylenol (500mg ) & Ibuprofen (600mg ) this am  - no relief  ?Denies fever or emesis  ?Intermittent nausea - none now  ? ?

## 2021-10-26 NOTE — Discharge Instructions (Addendum)
Advised patient to take medication as directed to completion for the next 30 days.  Advised patient to take medication first thing in the morning on empty stomach with 8 ounces of water and no other medication or foods for at least 45 minutes.  Encouraged patient to increase daily water intake while taking this medication.  Encouraged patient to avoid GERD triggering foods and beverages.  Advised patient if symptoms worsen and/or unresolved please follow-up with PCP or here for further evaluation. ?

## 2021-11-10 IMAGING — CT CT ABD-PELV W/ CM
2 of 4 series · 17 of 46 positions shown, 19 images · IV contrast (omnipaque)
Comparison: CT abdomen and pelvis 02/02/2014.

CLINICAL DATA: Acute abdominal pain.

EXAM:
CT ABDOMEN AND PELVIS WITH CONTRAST
TECHNIQUE: Multidetector CT imaging of the abdomen and pelvis was performed
using the standard protocol following bolus administration of
intravenous contrast.
CONTRAST:  75mL OMNIPAQUE IOHEXOL 350 MG/ML SOLN

[Series 2: abd pel w · axial · 0.72mm/px · z∈[+1005,+1450]mm · 14 of 97 slices shown, 16 images]
[im 4/97  soft-tissue]
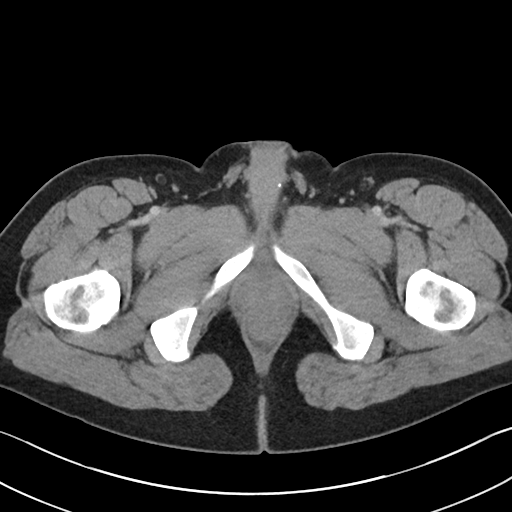
[im 4/97  bone]
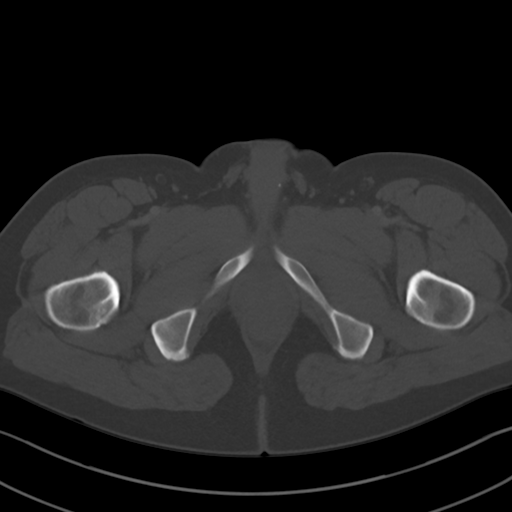
[im 12/97  soft-tissue]
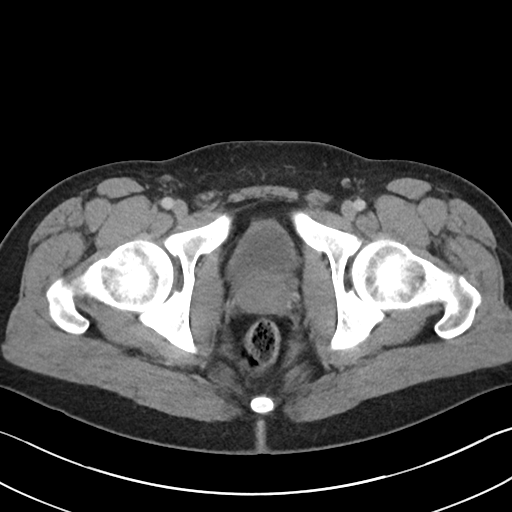
[im 20/97  soft-tissue]
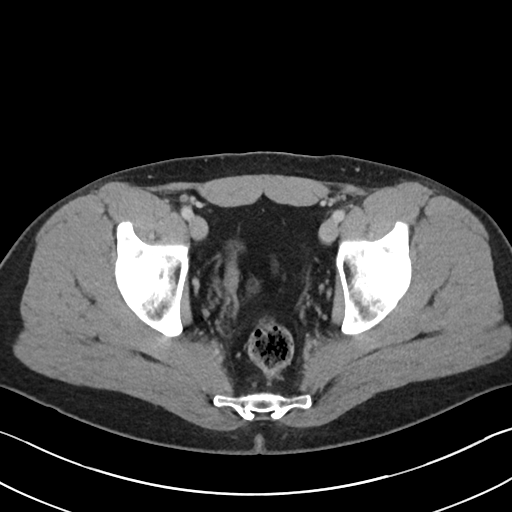
[im 27/97  soft-tissue]
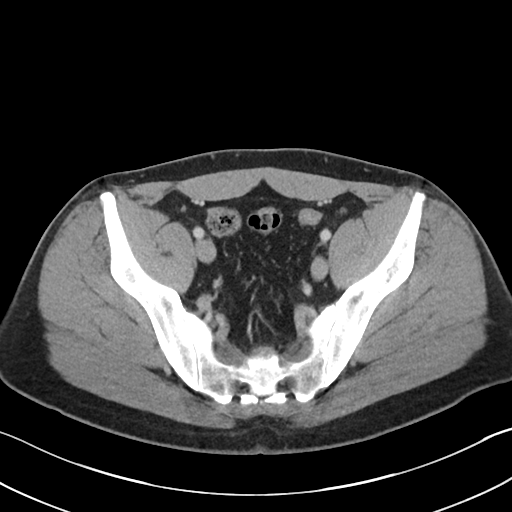
[im 31/97  soft-tissue]
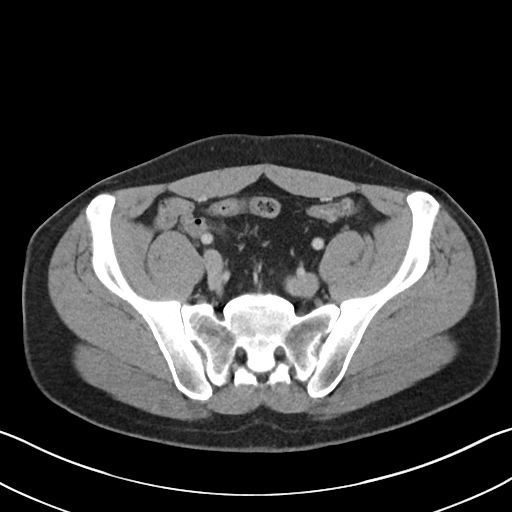
[im 39/97  soft-tissue]
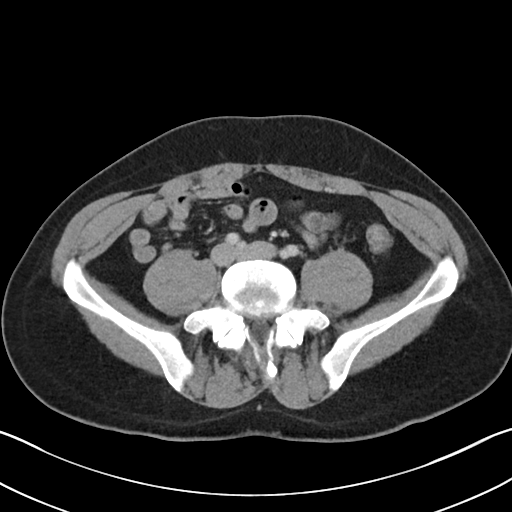
[im 47/97  soft-tissue]
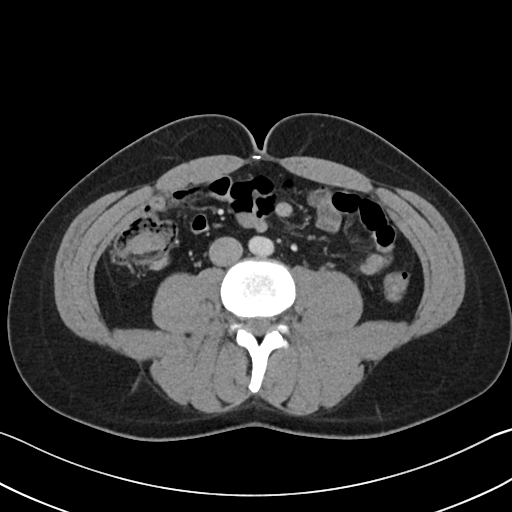
[im 50/97  soft-tissue]
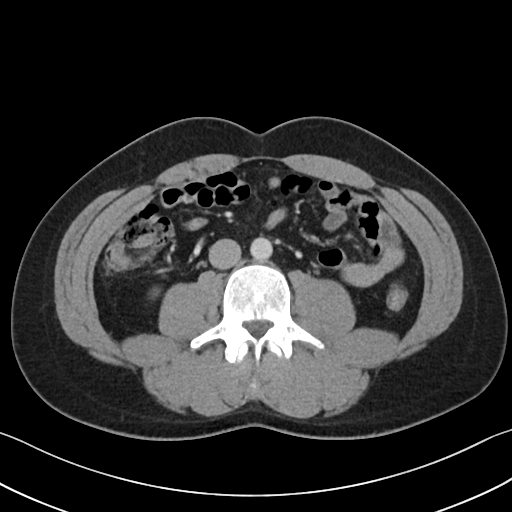
[im 58/97  soft-tissue]
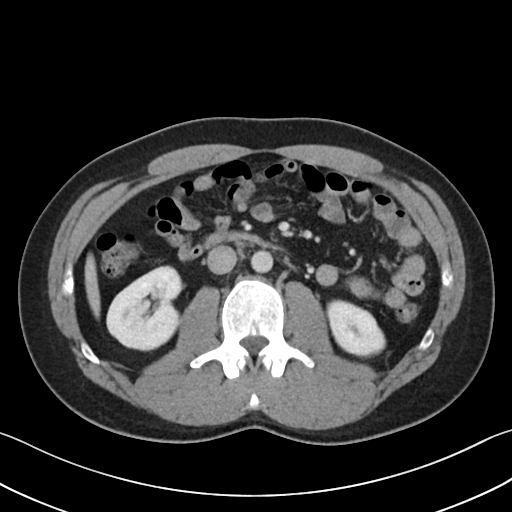
[im 58/97  bone]
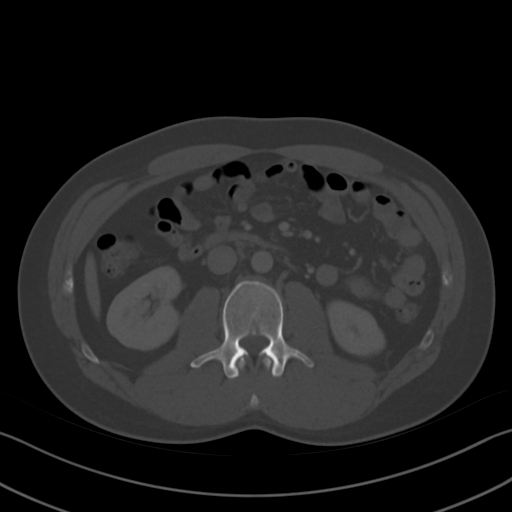
[im 66/97  soft-tissue]
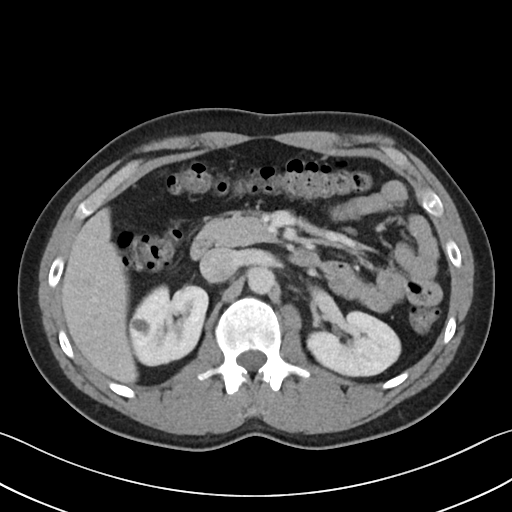
[im 73/97  soft-tissue]
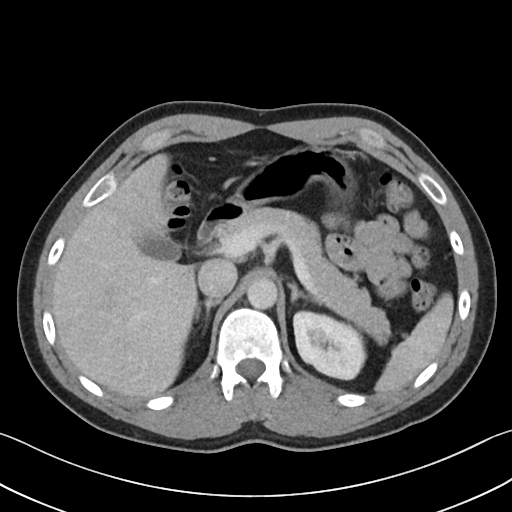
[im 77/97  soft-tissue]
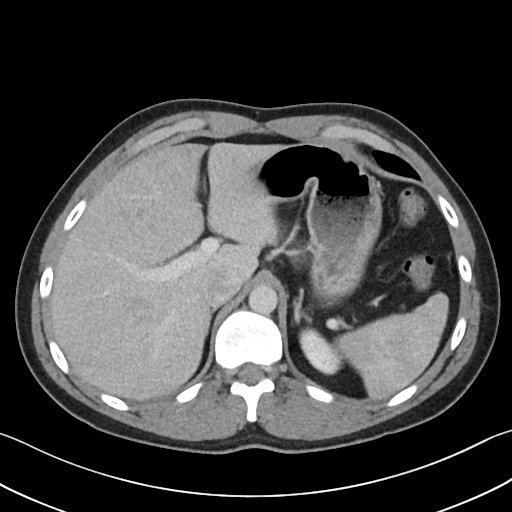
[im 85/97  soft-tissue]
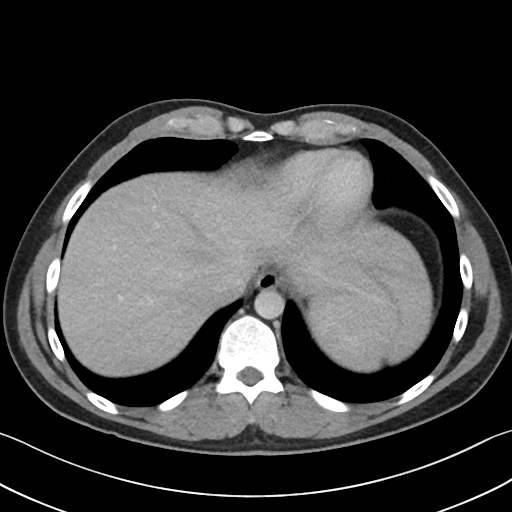
[im 93/97  soft-tissue]
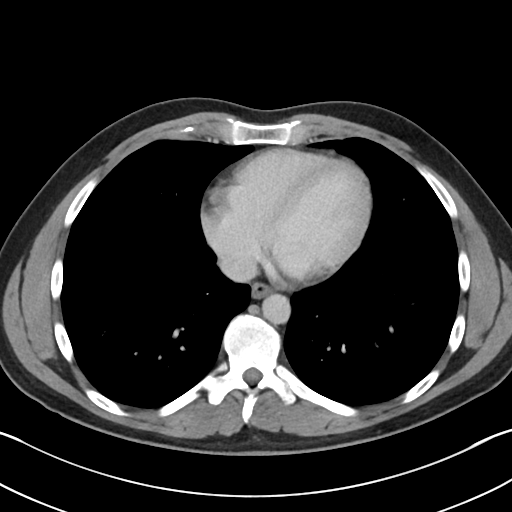

[Series 5: coronal · coronal · 0.86mm/px · 3 of 87 slices shown]
[im 29/87  soft-tissue]
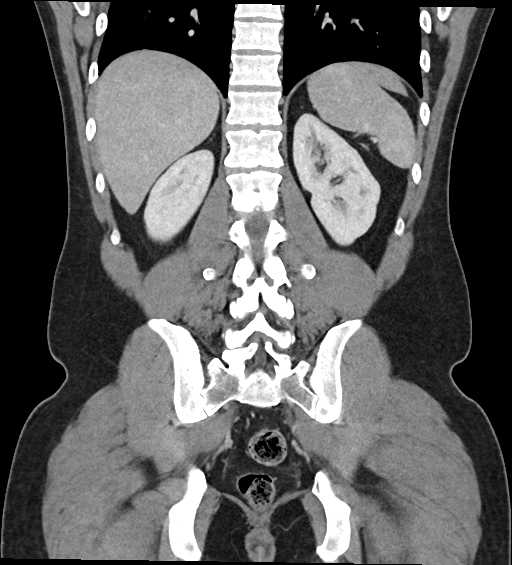
[im 39/87  soft-tissue]
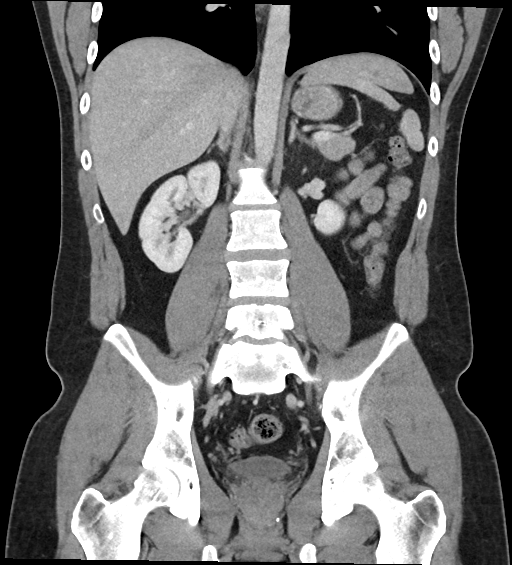
[im 48/87  soft-tissue]
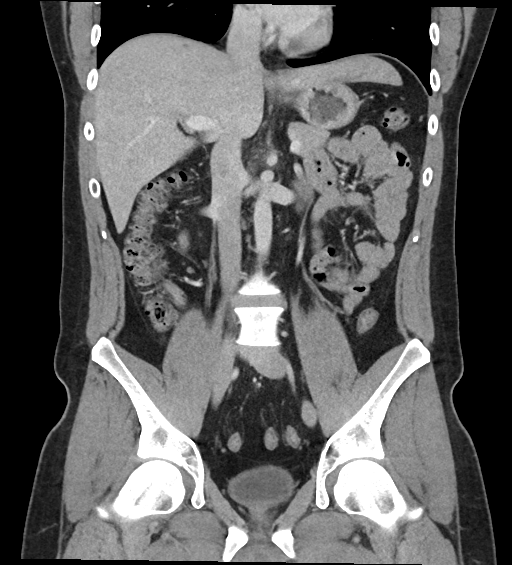

[17 of 46 positions shown; findings below may reference images not displayed]

FINDINGS: Lower chest: No acute abnormality.

Hepatobiliary: No focal liver abnormality is seen. No gallstones,
gallbladder wall thickening, or biliary dilatation.

Pancreas: Unremarkable. No pancreatic ductal dilatation or
surrounding inflammatory changes.

Spleen: Normal in size without focal abnormality.

Adrenals/Urinary Tract: Hypodensity in the right kidney is too small
to characterize and unchanged, likely a cyst. The kidneys otherwise
appear within normal limits. The bladder and adrenal glands are
within normal limits.

Stomach/Bowel: Stomach is within normal limits. Appendix appears
normal. No evidence of bowel wall thickening, distention, or
inflammatory changes.

Vascular/Lymphatic: No significant vascular findings are present. No
enlarged abdominal or pelvic lymph nodes.

Reproductive: Prostate is unremarkable.

Other: No abdominal wall hernia or abnormality. No abdominopelvic
ascites.

Musculoskeletal: No acute or significant osseous findings.
IMPRESSION: 1. No acute localizing process in the abdomen or pelvis.

## 2022-06-27 ENCOUNTER — Encounter: Payer: Self-pay | Admitting: Emergency Medicine

## 2022-06-27 ENCOUNTER — Other Ambulatory Visit: Payer: Self-pay

## 2022-06-27 ENCOUNTER — Ambulatory Visit
Admission: EM | Admit: 2022-06-27 | Discharge: 2022-06-27 | Disposition: A | Discharge status: Home / Self Care | Payer: Self-pay | Attending: Emergency Medicine | Admitting: Emergency Medicine

## 2022-06-27 DIAGNOSIS — J4521 Mild intermittent asthma with (acute) exacerbation: Secondary | ICD-10-CM

## 2022-06-27 DIAGNOSIS — H66003 Acute suppurative otitis media without spontaneous rupture of ear drum, bilateral: Secondary | ICD-10-CM

## 2022-06-27 DIAGNOSIS — J329 Chronic sinusitis, unspecified: Secondary | ICD-10-CM

## 2022-06-27 MED ORDER — ALBUTEROL SULFATE HFA 108 (90 BASE) MCG/ACT IN AERS: 2.0000 | INHALATION_SPRAY | Freq: Four times a day (QID) | RESPIRATORY_TRACT | 1 refills | Status: AC | PRN

## 2022-06-27 MED ORDER — METHYLPREDNISOLONE SODIUM SUCC 125 MG IJ SOLR
80.0000 mg | Freq: Once | INTRAMUSCULAR | Status: AC
  Administered 2022-06-27: 80 mg via INTRAMUSCULAR

## 2022-06-27 MED ORDER — FLUTICASONE PROPIONATE 50 MCG/ACT NA SUSP: 1.0000 | Freq: Every day | NASAL | 1 refills | Status: AC

## 2022-06-27 MED ORDER — MONTELUKAST SODIUM 10 MG PO TABS: 10.0000 mg | ORAL_TABLET | Freq: Every day | ORAL | 1 refills | Status: AC

## 2022-06-27 MED ORDER — METHYLPREDNISOLONE 8 MG PO TABS: 32.0000 mg | ORAL_TABLET | Freq: Every day | ORAL | 0 refills | Status: AC

## 2022-06-27 MED ORDER — CEFDINIR 300 MG PO CAPS: 300.0000 mg | ORAL_CAPSULE | Freq: Two times a day (BID) | ORAL | 0 refills | Status: AC

## 2022-06-27 MED ORDER — FEXOFENADINE HCL 180 MG PO TABS: 180.0000 mg | ORAL_TABLET | Freq: Every day | ORAL | 1 refills | Status: AC

## 2022-06-27 NOTE — ED Triage Notes (Signed)
Pt c/o cold like symptoms with chest congestion, cough and body aches for 2 weeks.

## 2022-06-27 NOTE — Discharge Instructions (Signed)
Please read below to learn more about the medications, dosages and frequencies that I recommend to help alleviate your symptoms and to get you feeling better soon:    Omnicef (cefdinir): To address the infection in both of your inner ears, please take 1 capsule twice daily for 10 days, you can take it with or without food.  This antibiotic can cause upset stomach, this will resolve once antibiotics are complete.  You are welcome to use a probiotic, eat yogurt, take Imodium while taking this medication.  Please avoid other systemic medications such as Maalox, Pepto-Bismol or milk of magnesia as they can interfere with your body's ability to absorb the antibiotics.       Solu-Medrol IM (methylprednisolone):  To quickly address your significant respiratory inflammation, you were provided with an injection of Solu-Medrol in the office today.  You should continue to feel the full benefit of the steroid for the next 4 to 6 hours.    Medrol (methylprednisolone): This is a steroid that will significantly calm your upper and lower airways, please take the daily recommended quantity of tablets daily with your breakfast meal starting tomorrow morning until the prescription is complete.      Allegra (fexofenadine): This is an excellent second-generation antihistamine that helps to reduce respiratory inflammatory response to environmental allergens.  This medication is not known to cause daytime sleepiness so it can be taken in the daytime.  If you find that it does make you sleepy, please feel free to take it at bedtime.   Singulair (montelukast): This is a mast cell stabilizer that works well with antihistamines.  Mast cells are responsible for stimulating histamine production so you can imagine that if we can reduce the activity of your mast cells, then fewer histamines will be produced and inflammation caused by allergy exposure will be significantly reduced.  I recommend that you take this medication at the same  time you take your antihistamine.   Flonase (fluticasone): This is a steroid nasal spray that you use once daily, 1 spray in each nare.  This medication does not work well if you decide to use it only used as you feel you need to, it works best used on a daily basis.  After 3 to 5 days of use, you will notice significant reduction of the inflammation and mucus production that is currently being caused by exposure to allergens, whether seasonal or environmental.  The most common side effect of this medication is nosebleeds.  If you experience a nosebleed, please discontinue use for 1 week, then feel free to resume.  I have provided you with a prescription.     ProAir, Ventolin, Proventil (albuterol): This inhaled medication contains a short acting beta agonist bronchodilator.  This medication works on the smooth muscle that opens and constricts of your airways by relaxing the muscle.  The result of relaxation of the smooth muscle is increased air movement and improved work of breathing.  This is a short acting medication that can be used every 4-6 hours as needed for increased work of breathing, shortness of breath, wheezing and excessive coughing.  I have provided you with a prescription.    If you find that your health insurance will not pay for allergy medications, please consider downloading the GoodRx app and using to get a better price than the "off the shelf" price.     If you find that you have not had improvement of your symptoms in the next 5 to 7 days, please  follow-up with your primary care provider or return here to urgent care for repeat evaluation and further recommendations.   Thank you for visiting urgent care today.  We appreciate the opportunity to participate in your care.

## 2022-06-27 NOTE — ED Provider Notes (Signed)
[facility]    CSN: [id number] Arrival date & time: 06/27/22  1730    HISTORY   Chief Complaint  Patient presents with   URI   HPI Frank Booth is a pleasant, 27 y.o. male who presents to urgent care today. Patient complains of cough, nasal congestion, facial pain, body aches for the past 2 weeks.  Patient reports a history of asthma, states he is not currently using an albuterol.  Patient denies history of allergies but states he is taking allergy medications in the past.  Patient denies hearing loss, purulent drainage from his sinuses, sputum production with cough.  Patient states cough is worse at night.  Patient denies fever, chills, known sick contacts, nausea, vomiting, diarrhea.  Patient states he has not tried any medications to alleviate his symptoms.  The history is provided by the patient.   Past Medical History:  Diagnosis Date   GERD (gastroesophageal reflux disease)    There are no problems to display for this patient.  Past Surgical History:  Procedure Laterality Date   BACK SURGERY     tubes in ears     UMBILICAL HERNIA REPAIR     Widom teeth extraction      Home Medications    Prior to Admission medications   Medication Sig Start Date End Date Taking? Authorizing Provider  albuterol (PROVENTIL HFA;VENTOLIN HFA) 108 (90 BASE) MCG/ACT inhaler Inhale 1-2 puffs into the lungs every 4 (four) hours as needed for wheezing or shortness of breath. Patient not taking: Reported on 07/12/2021 10/01/13   Lysbeth Penner, FNP  azithromycin [facility]) 250 MG tablet Take 2 po first day and then one po qd x 4 days Patient not taking: Reported on 07/12/2021 10/01/13   Lysbeth Penner, FNP  benzonatate (TESSALON) 200 MG capsule Take 1 capsule (200 mg total) by mouth 3 (three) times daily as needed for cough. Patient not taking: Reported on 07/12/2021 10/21/20   Orpah Greek, MD  cyclobenzaprine (FLEXERIL) 10 MG tablet Take 1 tablet (10 mg total) by  mouth 3 (three) times daily as needed for muscle spasms. Patient not taking: Reported on 10/26/2021 07/12/21   Marybelle Killings, MD  cyclobenzaprine (FLEXERIL) 10 MG tablet Take 1 tablet (10 mg total) by mouth 3 (three) times daily as needed for muscle spasms. Patient not taking: Reported on 10/26/2021 07/12/21   Marybelle Killings, MD  doxycycline (VIBRAMYCIN) 100 MG capsule Take 1 capsule (100 mg total) by mouth 2 (two) times daily. Patient not taking: Reported on 07/12/2021 12/13/16   Montine Circle, PA-C  ibuprofen (ADVIL) 800 MG tablet Take 1 tablet (800 mg total) by mouth 3 (three) times daily. Patient not taking: Reported on 07/12/2021 10/21/20   Orpah Greek, MD  methylPREDNISolone (MEDROL DOSEPAK) 4 MG TBPK tablet Take by mouth as directed. Patient not taking: Reported on 07/12/2021 07/04/21   [provider]  naproxen (NAPROSYN) 500 MG tablet Take 1 tablet (500 mg total) by mouth 2 (two) times daily with a meal. As needed for pain Patient not taking: Reported on 10/26/2021 09/02/16   Dorie Rank, MD  omeprazole (PRILOSEC) 40 MG capsule Take 1 tab PO daily x 30 days 10/26/21   Eliezer Lofts, FNP  ondansetron (ZOFRAN ODT) 4 MG disintegrating tablet Take 1 tablet (4 mg total) by mouth every 8 (eight) hours as needed for nausea or vomiting. Patient not taking: Reported on 07/12/2021 02/23/21   Roemhildt, Kathleen Argue    Family History  Family History  Problem Relation Age of Onset   Hypertension Father    Heart disease Maternal Grandfather    Hypertension Maternal Grandfather    Cancer Maternal Grandfather    Social History Social History   Tobacco Use   Smoking status: Never   Smokeless tobacco: Never  Vaping Use   Vaping Use: Never used  Substance Use Topics   Alcohol use: No   Drug use: No   Allergies   Patient has no known allergies.  Review of Systems Review of Systems Pertinent findings revealed after performing a 14 point review of systems has been noted in the  history of present illness.  Physical Exam Triage Vital Signs ED Triage Vitals  Enc Vitals Group     BP 04/15/21 0827 (!) 147/82     Pulse Rate 04/15/21 0827 72     Resp 04/15/21 0827 18     Temp 04/15/21 0827 98.3 F (36.8 C)     Temp Source 04/15/21 0827 Oral     SpO2 04/15/21 0827 98 %     Weight --      Height --      Head Circumference --      Peak Flow --      Pain Score 04/15/21 0826 5     Pain Loc --      Pain Edu? --      Excl. in GC? --   No data found.  Updated Vital Signs BP (!) 139/94 (BP Location: Right Arm)   Pulse 82   Temp 99.3 F (37.4 C) (Oral)   Resp 16   Ht 6\' 2"  (1.88 m)   Wt 226 lb (102.5 kg)   SpO2 98%   BMI 29.02 kg/m   Physical Exam Vitals and nursing note reviewed.  Constitutional:      General: He is not in acute distress.    Appearance: Normal appearance. He is not ill-appearing.  HENT:     Head: Normocephalic and atraumatic.     Salivary Glands: Right salivary gland is not diffusely enlarged or tender. Left salivary gland is not diffusely enlarged or tender.     Right Ear: Ear canal and external ear normal. No drainage. A middle ear effusion is present. There is no impacted cerumen. Tympanic membrane is bulging. Tympanic membrane is not injected or erythematous.     Left Ear: Ear canal and external ear normal. No drainage. A middle ear effusion is present. There is no impacted cerumen. Tympanic membrane is bulging. Tympanic membrane is not injected or erythematous.     Ears:     Comments: Bilateral EACs normal, both TMs bulging with clear fluid    Nose: Rhinorrhea present. No nasal deformity, septal deviation, signs of injury, nasal tenderness, mucosal edema or congestion. Rhinorrhea is clear.     Right Nostril: Occlusion present. No foreign body, epistaxis or septal hematoma.     Left Nostril: Occlusion present. No foreign body, epistaxis or septal hematoma.     Right Turbinates: Enlarged, swollen and pale.     Left Turbinates:  Enlarged, swollen and pale.     Right Sinus: No maxillary sinus tenderness or frontal sinus tenderness.     Left Sinus: No maxillary sinus tenderness or frontal sinus tenderness.     Mouth/Throat:     Lips: Pink. No lesions.     Mouth: Mucous membranes are moist. No oral lesions.     Pharynx: Oropharynx is clear. Uvula midline. No posterior oropharyngeal erythema or uvula  swelling.     Tonsils: No tonsillar exudate. 0 on the right. 0 on the left.     Comments: Postnasal drip Eyes:     General: Lids are normal.        Right eye: No discharge.        Left eye: No discharge.     Extraocular Movements: Extraocular movements intact.     Conjunctiva/sclera: Conjunctivae normal.     Right eye: Right conjunctiva is not injected.     Left eye: Left conjunctiva is not injected.  Neck:     Trachea: Trachea and phonation normal.  Cardiovascular:     Rate and Rhythm: Normal rate and regular rhythm.     Pulses: Normal pulses.     Heart sounds: Normal heart sounds. No murmur heard.    No friction rub. No gallop.  Pulmonary:     Effort: Pulmonary effort is normal. No accessory muscle usage, prolonged expiration or respiratory distress.     Breath sounds: Normal breath sounds. No stridor, decreased air movement or transmitted upper airway sounds. No decreased breath sounds, wheezing, rhonchi or rales.  Chest:     Chest wall: No tenderness.  Musculoskeletal:        General: Normal range of motion.     Cervical back: Normal range of motion and neck supple. Normal range of motion.  Lymphadenopathy:     Cervical: No cervical adenopathy.  Skin:    General: Skin is warm and dry.     Findings: No erythema or rash.  Neurological:     General: No focal deficit present.     Mental Status: He is alert and oriented to person, place, and time.  Psychiatric:        Mood and Affect: Mood normal.        Behavior: Behavior normal.     Visual Acuity Right Eye Distance:   Left Eye Distance:   Bilateral  Distance:    Right Eye Near:   Left Eye Near:    Bilateral Near:     UC Couse / Diagnostics / Procedures:     Radiology No results found.  Procedures Procedures (including critical care time) EKG  Pending results:  Labs Reviewed - No data to display  Medications Ordered in UC: Medications  methylPREDNISolone sodium succinate (SOLU-MEDROL) 125 mg/2 mL injection 80 mg (has no administration in time range)    UC Diagnoses / Final Clinical Impressions(s)   I have reviewed the triage vital signs and the nursing notes.  Pertinent labs & imaging results that were available during my care of the patient were reviewed by me and considered in my medical decision making (see chart for details).    Final diagnoses:  Mild intermittent asthma with acute exacerbation in adult  Rhinosinusitis  Non-recurrent acute suppurative otitis media of both ears without spontaneous rupture of tympanic membranes   Will treat patient empirically with a 10-day course of cefdinir for presumed bilateral bacterial inner ear infection as well as likely bacterial sinusitis.  Patient provided with renewals of albuterol and methylprednisolone to help improve his work of breathing.  Patient advised to take Allegra, Flonase and Singulair throughout the winter or until advised to discontinue. Please see discharge instructions below for further details of plan of care as provided to patient. ED Prescriptions     Medication Sig Dispense Auth. Provider   methylPREDNISolone (MEDROL) 8 MG tablet Take 4 tablets (32 mg total) by mouth daily for 3 days. 12 tablet Theadora Rama  Scales, PA-C   albuterol (VENTOLIN HFA) 108 (90 Base) MCG/ACT inhaler Inhale 2 puffs into the lungs every 6 (six) hours as needed for wheezing or shortness of breath (Cough). 54 g Theadora Rama Scales, PA-C   fexofenadine (ALLEGRA) 180 MG tablet Take 1 tablet (180 mg total) by mouth daily. 90 tablet Theadora Rama Scales, PA-C   fluticasone  (FLONASE) 50 MCG/ACT nasal spray Place 1 spray into both nostrils daily. 47.4 mL Theadora Rama Scales, PA-C   cefdinir (OMNICEF) 300 MG capsule Take 1 capsule (300 mg total) by mouth 2 (two) times daily for 10 days. 20 capsule Theadora Rama Scales, PA-C   montelukast (SINGULAIR) 10 MG tablet Take 1 tablet (10 mg total) by mouth at bedtime. 90 tablet Theadora Rama Scales, [place]      PDMP not reviewed this encounter.  Disposition Upon Discharge:  Condition: stable for discharge home Home: take medications as prescribed; routine discharge instructions as discussed; follow up as advised.  Patient presented with an acute illness with associated systemic symptoms and significant discomfort requiring urgent management. In my opinion, this is a condition that a prudent lay person (someone who possesses an average knowledge of health and medicine) may potentially expect to result in complications if not addressed urgently such as respiratory distress, impairment of bodily function or dysfunction of bodily organs.   Routine symptom specific, illness specific and/or disease specific instructions were discussed with the patient and/or caregiver at length.   As such, the patient has been evaluated and assessed, work-up was performed and treatment was provided in alignment with urgent care protocols and evidence based medicine.  Patient/parent/caregiver has been advised that the patient may require follow up for further testing and treatment if the symptoms continue in spite of treatment, as clinically indicated and appropriate.  If the patient was tested for COVID-19, Influenza and/or RSV, then the patient/parent/guardian was advised to isolate at home pending the results of his/her diagnostic coronavirus test and potentially longer if they're positive. I have also advised pt that if his/her COVID-19 test returns positive, it's recommended to self-isolate for at least 10 days after symptoms first appeared  AND until fever-free for 24 hours without fever reducer AND other symptoms have improved or resolved. Discussed self-isolation recommendations as well as instructions for household member/close contacts as per the [facility] and [place] DHHS, and also gave patient the COVID packet with this information.  Patient/parent/caregiver has been advised to return to the [facility] or PCP in 3-5 days if no better; to PCP or the Emergency Department if new signs and symptoms develop, or if the current signs or symptoms continue to change or worsen for further workup, evaluation and treatment as clinically indicated and appropriate  The patient will follow up with their current PCP if and as advised. If the patient does not currently have a PCP we will assist them in obtaining one.   The patient may need specialty follow up if the symptoms continue, in spite of conservative treatment and management, for further workup, evaluation, consultation and treatment as clinically indicated and appropriate.  Patient/parent/caregiver verbalized understanding and agreement of plan as discussed.  All questions were addressed during visit.  Please see discharge instructions below for further details of plan.  Discharge Instructions:   Discharge Instructions      Please read below to learn more about the medications, dosages and frequencies that I recommend to help alleviate your symptoms and to get you feeling better soon:    Omnicef (cefdinir): To address  the infection in both of your inner ears, please take 1 capsule twice daily for 10 days, you can take it with or without food.  This antibiotic can cause upset stomach, this will resolve once antibiotics are complete.  You are welcome to use a probiotic, eat yogurt, take Imodium while taking this medication.  Please avoid other systemic medications such as Maalox, Pepto-Bismol or milk of magnesia as they can interfere with your body's ability to absorb the antibiotics.       Solu-Medrol  IM (methylprednisolone):  To quickly address your significant respiratory inflammation, you were provided with an injection of Solu-Medrol in the office today.  You should continue to feel the full benefit of the steroid for the next 4 to 6 hours.    Medrol (methylprednisolone): This is a steroid that will significantly calm your upper and lower airways, please take the daily recommended quantity of tablets daily with your breakfast meal starting tomorrow morning until the prescription is complete.      Allegra (fexofenadine): This is an excellent second-generation antihistamine that helps to reduce respiratory inflammatory response to environmental allergens.  This medication is not known to cause daytime sleepiness so it can be taken in the daytime.  If you find that it does make you sleepy, please feel free to take it at bedtime.   Singulair (montelukast): This is a mast cell stabilizer that works well with antihistamines.  Mast cells are responsible for stimulating histamine production so you can imagine that if we can reduce the activity of your mast cells, then fewer histamines will be produced and inflammation caused by allergy exposure will be significantly reduced.  I recommend that you take this medication at the same time you take your antihistamine.   Flonase (fluticasone): This is a steroid nasal spray that you use once daily, 1 spray in each nare.  This medication does not work well if you decide to use it only used as you feel you need to, it works best used on a daily basis.  After 3 to 5 days of use, you will notice significant reduction of the inflammation and mucus production that is currently being caused by exposure to allergens, whether seasonal or environmental.  The most common side effect of this medication is nosebleeds.  If you experience a nosebleed, please discontinue use for 1 week, then feel free to resume.  I have provided you with a prescription.     ProAir, Ventolin,  Proventil (albuterol): This inhaled medication contains a short acting beta agonist bronchodilator.  This medication works on the smooth muscle that opens and constricts of your airways by relaxing the muscle.  The result of relaxation of the smooth muscle is increased air movement and improved work of breathing.  This is a short acting medication that can be used every 4-6 hours as needed for increased work of breathing, shortness of breath, wheezing and excessive coughing.  I have provided you with a prescription.    If you find that your health insurance will not pay for allergy medications, please consider downloading the GoodRx app and using to get a better price than the "off the shelf" price.     If you find that you have not had improvement of your symptoms in the next 5 to 7 days, please follow-up with your primary care provider or return here to urgent care for repeat evaluation and further recommendations.   Thank you for visiting urgent care today.  We appreciate the opportunity to  participate in your care.       This office note has been dictated using Teaching laboratory technician.  Unfortunately, this method of dictation can sometimes lead to typographical or grammatical errors.  I apologize for your inconvenience in advance if this occurs.  Please do not hesitate to reach out to me if clarification is needed.      Theadora Rama Scales, PA-C 06/27/22 1924

## 2023-03-02 ENCOUNTER — Encounter: Payer: Self-pay | Admitting: Orthopedic Surgery

## 2023-03-02 ENCOUNTER — Ambulatory Visit: Payer: Medicaid Other | Admitting: Orthopedic Surgery

## 2023-03-02 VITALS — Ht 74.0 in | Wt 229.0 lb

## 2023-03-02 DIAGNOSIS — S93492A Sprain of other ligament of left ankle, initial encounter: Secondary | ICD-10-CM

## 2023-03-02 NOTE — Progress Notes (Signed)
New Patient Visit  Assessment: Frank Booth is a 27 y.o. male with the following: 1. Sprain of anterior talofibular ligament of left ankle, initial encounter   Plan: Frank Booth Sustained a Left ankle sprain Elevate as much as possible Use ice to help with swelling and inflammation Medications as needed WBAT Transition out of boot/brace as soon as possible Exercises provided Can review rehab exercises on YouTube as well Symptoms can linger If slow to progress, consider formal PT   Follow-up: Return in about 2 weeks (around 03/16/2023).  Subjective:  Chief Complaint  Patient presents with   Ankle Pain    L ankle/foot pain afterr feeling a pop, had immediate pain and swelling. DOI 02/27/23    History of Present Illness: Frank Booth is a 27 y.o. male who presents for evaluation of left ankle pain.  He was walking a couple of days ago, when he rolled his ankle.  He felt a pop.  He immediately fell to the ground.  He presented to an urgent care center.  Radiographs were negative for fracture.  He was given a walking boot, and advised to follow-up in clinic.  He has continued to work, but cannot go up and down ladders.  He takes the boot off at home.  He is taking Toradol for kidney stones.  No other injuries.  He has never injured his left ankle before.   Review of Systems: No fevers or chills No numbness or tingling No chest pain No shortness of breath No bowel or bladder dysfunction No GI distress No headaches   Medical History:  Past Medical History:  Diagnosis Date   GERD (gastroesophageal reflux disease)     Past Surgical History:  Procedure Laterality Date   BACK SURGERY     tubes in ears     UMBILICAL HERNIA REPAIR     Widom teeth extraction      Family History  Problem Relation Age of Onset   Hypertension Father    Heart disease Maternal Grandfather    Hypertension Maternal Grandfather    Cancer Maternal Grandfather    Social  History   Tobacco Use   Smoking status: Never   Smokeless tobacco: Never  Vaping Use   Vaping status: Never Used  Substance Use Topics   Alcohol use: No   Drug use: No    No Known Allergies  Current Meds  Medication Sig   cyclobenzaprine (FLEXERIL) 10 MG tablet Take by mouth.   oxyCODONE (OXY IR/ROXICODONE) 5 MG immediate release tablet Take by mouth.    Objective: Ht 6\' 2"  (1.88 m)   Wt 229 lb (103.9 kg)   BMI 29.40 kg/m   Physical Exam:  General: Alert and oriented. and No acute distress. Gait: Left sided antalgic gait.  Wearing a walking boot  Evaluation of left ankle demonstrates diffuse swelling.  He has ecchymosis over the lateral portion of the leg, extending to the dependent portion of the leg.  Tenderness to palpation along the peroneals.  Mild tenderness to palpation over the ATFL.  Toes warm and well-perfused.  Sensation intact over the dorsum of the foot.  He can get to a plantigrade position.  He has pain with inversion of the ankle.    IMAGING: I personally reviewed images previously obtained from the ED          New Medications:  No orders of the defined types were placed in this encounter.     Oliver Barre, MD  03/02/2023 9:38 AM

## 2023-03-02 NOTE — Patient Instructions (Signed)

## 2023-03-12 ENCOUNTER — Other Ambulatory Visit: Payer: Self-pay | Admitting: Urology

## 2023-03-15 ENCOUNTER — Encounter (HOSPITAL_BASED_OUTPATIENT_CLINIC_OR_DEPARTMENT_OTHER): Payer: Self-pay | Admitting: Urology

## 2023-03-15 NOTE — Progress Notes (Signed)
Patient returned phone call regarding lithotripsy on Monday 03/19/23.  Reminded patient to review paperwork in blue folder and bring blue folder with him Monday morning.  Patient to wear loose fitting comfortable clothes. No metal zippers, button or belt buckles on pants. Wear tennis shoes or sneakers, no sandals, flip flops, or crocs.  Reviewed allergies, medications, and medical/surgical history. Denies tobacco, drug, or alcohol use.  Denies shortness of breath when climbing stairs, no home oxygen use, no diagnosis of sleep apnea, no recent chest pain or positive covid test.  No aspirin 72 hours, ibuprofen products or pepto bismol 48 hours prior to procedures. No alcohol 24 hours before lithotripsy.  Must have a driver and someone to stay overnight, can not be left home alone for 24 hours following procedure.   Back may be sore or have a reddened rash. Heating pad can be used for soreness, do not place directly on skin.  Reviewed arrival time, location of facility, and phone number to call with any questions or concerns. [phone]

## 2023-03-15 NOTE — Progress Notes (Signed)
Pre-op phone call attempted. Left voicemail for patient to call back.

## 2023-03-16 ENCOUNTER — Ambulatory Visit: Payer: Medicaid Other | Admitting: Orthopedic Surgery

## 2023-03-19 ENCOUNTER — Other Ambulatory Visit: Payer: Self-pay

## 2023-03-19 ENCOUNTER — Ambulatory Visit (HOSPITAL_BASED_OUTPATIENT_CLINIC_OR_DEPARTMENT_OTHER)
Admission: RE | Admit: 2023-03-19 | Discharge: 2023-03-19 | Disposition: A | Discharge status: Home / Self Care | Payer: Medicaid Other | Attending: Urology | Admitting: Urology

## 2023-03-19 ENCOUNTER — Encounter (HOSPITAL_BASED_OUTPATIENT_CLINIC_OR_DEPARTMENT_OTHER): Payer: Self-pay | Admitting: Urology

## 2023-03-19 ENCOUNTER — Encounter (HOSPITAL_BASED_OUTPATIENT_CLINIC_OR_DEPARTMENT_OTHER)
Admission: RE | Disposition: A | Discharge status: Home / Self Care | Payer: Self-pay | Source: Home / Self Care | Attending: Urology

## 2023-03-19 ENCOUNTER — Ambulatory Visit (HOSPITAL_COMMUNITY): Payer: Medicaid Other

## 2023-03-19 DIAGNOSIS — N201 Calculus of ureter: Secondary | ICD-10-CM | POA: Insufficient documentation

## 2023-03-19 HISTORY — PX: EXTRACORPOREAL SHOCK WAVE LITHOTRIPSY: SHX1557

## 2023-03-19 SURGERY — LITHOTRIPSY, ESWL
Anesthesia: LOCAL | Laterality: Left

## 2023-03-19 MED ORDER — CIPROFLOXACIN HCL 500 MG PO TABS
ORAL_TABLET | ORAL | Status: AC
  Filled 2023-03-19: qty 1

## 2023-03-19 MED ORDER — CIPROFLOXACIN HCL 500 MG PO TABS
500.0000 mg | ORAL_TABLET | ORAL | Status: AC
  Administered 2023-03-19: 500 mg via ORAL

## 2023-03-19 MED ORDER — ONDANSETRON 8 MG PO TBDP: 8.0000 mg | ORAL_TABLET | Freq: Three times a day (TID) | ORAL | 1 refills | Status: AC | PRN

## 2023-03-19 MED ORDER — DIPHENHYDRAMINE HCL 25 MG PO CAPS
ORAL_CAPSULE | ORAL | Status: AC
  Filled 2023-03-19: qty 1

## 2023-03-19 MED ORDER — DIPHENHYDRAMINE HCL 25 MG PO CAPS
25.0000 mg | ORAL_CAPSULE | ORAL | Status: AC
  Administered 2023-03-19: 25 mg via ORAL

## 2023-03-19 MED ORDER — OXYCODONE HCL 5 MG PO TABS: 5.0000 mg | ORAL_TABLET | Freq: Four times a day (QID) | ORAL | 0 refills | Status: AC | PRN

## 2023-03-19 MED ORDER — DIAZEPAM 5 MG PO TABS
ORAL_TABLET | ORAL | Status: AC
  Filled 2023-03-19: qty 2

## 2023-03-19 MED ORDER — SODIUM CHLORIDE 0.9 % IV SOLN: INTRAVENOUS | Status: DC

## 2023-03-19 MED ORDER — DIAZEPAM 5 MG PO TABS
10.0000 mg | ORAL_TABLET | ORAL | Status: AC
  Administered 2023-03-19: 10 mg via ORAL

## 2023-03-19 NOTE — Brief Op Note (Signed)
03/19/2023  8:06 AM  PATIENT:  Frank Booth  27 y.o. male  PRE-OPERATIVE DIAGNOSIS:  LEFT URETERAL CALCULUS  POST-OPERATIVE DIAGNOSIS:  * No post-op diagnosis entered *  PROCEDURE:  Procedure(s) with comments: LEFT EXTRACORPOREAL SHOCK WAVE LITHOTRIPSY (ESWL) (Left) - 75 MINUTES  SURGEON:  Surgeons and Role:    * Felisha Claytor, Delbert Phenix., MD - Primary  PHYSICIAN ASSISTANT:   ASSISTANTS: none   ANESTHESIA:   MAC  EBL:  minimal   BLOOD ADMINISTERED:none  DRAINS: none   LOCAL MEDICATIONS USED:  NONE  SPECIMEN:  No Specimen  DISPOSITION OF SPECIMEN:  N/A  COUNTS:  YES  TOURNIQUET:  * No tourniquets in log *  DICTATION: .Note written in paper chart  PLAN OF CARE: Discharge to home after PACU  PATIENT DISPOSITION:  Short Stay   Delay start of Pharmacological VTE agent (>24hrs) due to surgical blood loss or risk of bleeding: not applicable

## 2023-03-19 NOTE — H&P (Signed)
Frank Booth is an 27 y.o. male.    Chief Complaint: Pre-Op LEFT Shockwave Lithotripsy  HPI:   1 - LEFT Proximal Ureteral Stone - 4mm left prox stone by ER CT 02/2023. Stone is solitary and at level of L3 transverse process. Given trial of medical therapy but no interval passage or significant progression on serial imaging.  Today "Frank Booth" is seen to proceed with LEFT shockwave lithotripsy. Most recent UA without infectious paramenters.   Past Medical History:  Diagnosis Date   Asthma    GERD (gastroesophageal reflux disease)    History of kidney stones     Past Surgical History:  Procedure Laterality Date   BACK SURGERY     tubes in ears     UMBILICAL HERNIA REPAIR     Widom teeth extraction      Family History  Problem Relation Age of Onset   Hypertension Father    Heart disease Maternal Grandfather    Hypertension Maternal Grandfather    Cancer Maternal Grandfather    Social History:  reports that he has never smoked. He has never used smokeless tobacco. He reports that he does not drink alcohol and does not use drugs.  Allergies: No Known Allergies  No medications prior to admission.    No results found for this or any previous visit (from the past 48 hour(s)). No results found.  Review of Systems  Constitutional:  Negative for chills and fever.  Genitourinary:  Positive for flank pain.  All other systems reviewed and are negative.   There were no vitals taken for this visit. Physical Exam HENT:     Head: Normocephalic.     Mouth/Throat:     Mouth: Mucous membranes are moist.  Eyes:     Pupils: Pupils are equal, round, and reactive to light.  Cardiovascular:     Rate and Rhythm: Normal rate.  Pulmonary:     Effort: Pulmonary effort is normal.  Abdominal:     General: Abdomen is flat.  Genitourinary:    Comments: Mild left cvat at present Musculoskeletal:        General: Normal range of motion.     Cervical back: Normal range of motion.   Skin:    General: Skin is warm.  Neurological:     General: No focal deficit present.     Mental Status: He is alert.  Psychiatric:        Mood and Affect: Mood normal.      Assessment/Plan  Proceed as planned with LEFT shockwave lithotripsy. Risks, benefits, alternatives, expected peri-op course discussed in detail.   Loletta Parish., MD 03/19/2023, 5:27 AM

## 2023-03-19 NOTE — Discharge Instructions (Addendum)
1 - You may have urinary urgency (bladder spasms), pass small stone fragments, and bloody urine on / off for up to 2 weeks. This is normal.  2 - Call MD or go to ER for fever >102, severe pain / nausea / vomiting not relieved by medications, or acute change in medical status     Post Anesthesia Home Care Instructions  Activity: Get plenty of rest for the remainder of the day. A responsible individual must stay with you for 24 hours following the procedure.  For the next 24 hours, DO NOT: -Drive a car -Paediatric nurse -Drink alcoholic beverages -Take any medication unless instructed by your physician -Make any legal decisions or sign important papers.  Meals: Start with liquid foods such as gelatin or soup. Progress to regular foods as tolerated. Avoid greasy, spicy, heavy foods. If nausea and/or vomiting occur, drink only clear liquids until the nausea and/or vomiting subsides. Call your physician if vomiting continues.  Special Instructions/Symptoms: Your throat may feel dry or sore from the anesthesia or the breathing tube placed in your throat during surgery. If this causes discomfort, gargle with warm salt water. The discomfort should disappear within 24 hours.

## 2023-03-20 ENCOUNTER — Encounter (HOSPITAL_BASED_OUTPATIENT_CLINIC_OR_DEPARTMENT_OTHER): Payer: Self-pay | Admitting: Urology
